# Patient Record
Sex: Male | Born: 1975 | Race: White | Hispanic: No | Marital: Married | Smoking: Former smoker
Health system: Southern US, Community
[De-identification: ages and names within clinical notes are randomized; demographics above are authoritative.]

## PROBLEM LIST (undated history)

## (undated) DIAGNOSIS — M1990 Unspecified osteoarthritis, unspecified site: Secondary | ICD-10-CM

## (undated) DIAGNOSIS — J45909 Unspecified asthma, uncomplicated: Secondary | ICD-10-CM

## (undated) DIAGNOSIS — N200 Calculus of kidney: Secondary | ICD-10-CM

## (undated) DIAGNOSIS — K219 Gastro-esophageal reflux disease without esophagitis: Secondary | ICD-10-CM

## (undated) DIAGNOSIS — M419 Scoliosis, unspecified: Secondary | ICD-10-CM

## (undated) DIAGNOSIS — Z87442 Personal history of urinary calculi: Secondary | ICD-10-CM

## (undated) HISTORY — PX: CARPAL TUNNEL RELEASE: SHX101

## (undated) HISTORY — DX: Scoliosis, unspecified: M41.9

## (undated) HISTORY — PX: KNEE ARTHROSCOPY: SUR90

## (undated) HISTORY — PX: FRACTURE SURGERY: SHX138

## (undated) HISTORY — DX: Calculus of kidney: N20.0

## (undated) HISTORY — PX: OTHER SURGICAL HISTORY: SHX169

## (undated) HISTORY — PX: APPENDECTOMY: SHX54

---

## 2014-12-24 ENCOUNTER — Other Ambulatory Visit (HOSPITAL_COMMUNITY): Payer: Self-pay | Admitting: "Endocrinology

## 2014-12-24 DIAGNOSIS — E298 Other testicular dysfunction: Secondary | ICD-10-CM

## 2015-01-02 ENCOUNTER — Other Ambulatory Visit (HOSPITAL_COMMUNITY): Payer: Self-pay

## 2015-01-03 ENCOUNTER — Ambulatory Visit (HOSPITAL_COMMUNITY)
Admission: RE | Admit: 2015-01-03 | Discharge: 2015-01-03 | Disposition: A | Discharge status: Home / Self Care | Payer: No Typology Code available for payment source | Source: Ambulatory Visit | Attending: "Endocrinology | Admitting: "Endocrinology

## 2015-01-03 ENCOUNTER — Other Ambulatory Visit (HOSPITAL_COMMUNITY): Payer: Self-pay | Admitting: "Endocrinology

## 2015-01-03 DIAGNOSIS — Z87828 Personal history of other (healed) physical injury and trauma: Secondary | ICD-10-CM | POA: Insufficient documentation

## 2015-01-03 DIAGNOSIS — E298 Other testicular dysfunction: Secondary | ICD-10-CM

## 2015-01-03 DIAGNOSIS — E348 Other specified endocrine disorders: Secondary | ICD-10-CM | POA: Diagnosis present

## 2015-01-03 MED ORDER — GADOBENATE DIMEGLUMINE 529 MG/ML IV SOLN
10.0000 mL | Freq: Once | INTRAVENOUS | Status: AC | PRN
  Administered 2015-01-03: 10 mL via INTRAVENOUS

## 2015-01-03 MED ORDER — SODIUM CHLORIDE 0.9 % IV SOLN
INTRAVENOUS | Status: AC
  Filled 2015-01-03: qty 150

## 2015-01-03 MED ORDER — SODIUM CHLORIDE 0.9 % IJ SOLN
INTRAMUSCULAR | Status: AC
  Filled 2015-01-03: qty 15

## 2015-04-04 ENCOUNTER — Telehealth: Payer: Self-pay

## 2015-04-04 ENCOUNTER — Other Ambulatory Visit: Payer: Self-pay | Admitting: "Endocrinology

## 2015-04-04 MED ORDER — TESTOSTERONE CYPIONATE 200 MG/ML IM SOLN: 100.0000 mg | INTRAMUSCULAR | Status: DC

## 2015-04-04 NOTE — Telephone Encounter (Signed)
Pt need refill on Testosterone. Printed and signed

## 2015-04-07 NOTE — Telephone Encounter (Signed)
Prescription faxed

## 2015-04-25 ENCOUNTER — Encounter: Payer: Self-pay | Admitting: "Endocrinology

## 2015-04-25 ENCOUNTER — Ambulatory Visit (INDEPENDENT_AMBULATORY_CARE_PROVIDER_SITE_OTHER): Payer: No Typology Code available for payment source | Admitting: "Endocrinology

## 2015-04-25 VITALS — BP 142/100 | HR 89 | Ht 70.0 in | Wt 239.4 lb

## 2015-04-25 DIAGNOSIS — R7303 Prediabetes: Secondary | ICD-10-CM

## 2015-04-25 DIAGNOSIS — E291 Testicular hypofunction: Secondary | ICD-10-CM | POA: Diagnosis not present

## 2015-04-25 DIAGNOSIS — E559 Vitamin D deficiency, unspecified: Secondary | ICD-10-CM | POA: Insufficient documentation

## 2015-04-25 MED ORDER — VITAMIN D (ERGOCALCIFEROL) 1.25 MG (50000 UNIT) PO CAPS: 50000.0000 [IU] | ORAL_CAPSULE | ORAL | Status: DC

## 2015-04-25 MED ORDER — TESTOSTERONE CYPIONATE 200 MG/ML IM SOLN: 100.0000 mg | INTRAMUSCULAR | Status: DC

## 2015-04-25 NOTE — Progress Notes (Signed)
Subjective:    Patient ID: Ian Robinson, male    DOB: 1975/06/13, PCP No PCP Per Patient   Past Medical History  Diagnosis Date  . Scoliosis   . Kidney stone    Past Surgical History  Procedure Laterality Date  . Appendectomy    . Arm fracture surgery     Social History   Social History  . Marital Status: Married    Spouse Name: N/A  . Number of Children: N/A  . Years of Education: N/A   Social History Main Topics  . Smoking status: Former Smoker    Quit date: 06/08/2007  . Smokeless tobacco: Never Used  . Alcohol Use: 3.6 oz/week    6 Cans of beer per week     Comment: occ 2 to 3 times a week  . Drug Use: No  . Sexual Activity: Not Asked   Other Topics Concern  . None   Social History Narrative  . None   Outpatient Encounter Prescriptions as of 04/25/2015  Medication Sig  . cetirizine (ZYRTEC) 10 MG tablet Take 10 mg by mouth daily.  . cyclobenzaprine (FLEXERIL) 10 MG tablet Take 10 mg by mouth as needed for muscle spasms.  . montelukast (SINGULAIR) 10 MG tablet Take 10 mg by mouth at bedtime.  Marland Kitchen testosterone cypionate (DEPOTESTOSTERONE CYPIONATE) 200 MG/ML injection Inject 0.5 mLs (100 mg total) into the muscle every 14 (fourteen) days.  . [DISCONTINUED] testosterone cypionate (DEPOTESTOSTERONE CYPIONATE) 200 MG/ML injection Inject 100 mg into the muscle every 14 (fourteen) days.  Marland Kitchen testosterone cypionate (DEPOTESTOSTERONE CYPIONATE) 200 MG/ML injection Inject 0.5 mLs (100 mg total) into the muscle every 14 (fourteen) days. (Patient not taking: Reported on 04/25/2015)  . Vitamin D, Ergocalciferol, (DRISDOL) 50000 UNITS CAPS capsule Take 1 capsule (50,000 Units total) by mouth every 7 (seven) days.   No facility-administered encounter medications on file as of 04/25/2015.   ALLERGIES: No Known Allergies VACCINATION STATUS:  There is no immunization history on file for this patient.  HPI  Ian Robinson is a 39 yr old male with medical hx as above. he is here  to f/u for hypogonadism with repeat labs. He was initiated on testosterone last visit. He used 200 mg every 2 weeks instead of 100 mg every 2 weeks. He started to feel better, currently having more energy than before.   On interview, he has no specific complaints. he has normal libido. he did not father any children, saying that his wife has fertility problems. he was subjected to sperm analysis in the past when he was found to have "low sperm count and motility". he denies testicular injury, chemotherapy, radiation, nor STDs. He reiterated that they are not looking for fertility. His MRI is negative for pituitary/sella mass lesion nor adenoma. he has remote ( at age 73) past face injury with no associated intracranial injury. His prolactin level was also normal, see below.  he has never taken androgens, steroids, nor opiates. he has new c/o frequent sinus infections.   Review of Systems  Constitutional: no weight gain/loss, no fatigue, no subjective hyperthermia/hypothermia Eyes: no blurry vision, no xerophthalmia ENT: no sore throat, no nodules palpated in throat, no dysphagia/odynophagia, no hoarseness Cardiovascular: no CP/SOB/palpitations/leg swelling Respiratory: no cough/SOB Gastrointestinal: no N/V/D/C Musculoskeletal: no muscle/joint aches Skin: no rashes Neurological: no tremors/numbness/tingling/dizziness Psychiatric: no depression/anxiety  Objective:    BP 142/100 mmHg  Pulse 89  Ht  (1.778 m)  Wt 239 lb 6.4 oz (108.591 kg)  BMI  34.35 kg/m2  SpO2 95%  Wt Readings from Last 3 Encounters:  04/25/15 239 lb 6.4 oz (108.591 kg)  01/03/15 235 lb (106.595 kg)    Physical Exam   Constitutional: overweight, in NAD Eyes: PERRLA, EOMI, no exophthalmos ENT: moist mucous membranes, no thyromegaly, no cervical lymphadenopathy Cardiovascular: RRR, No MRG Respiratory: CTA B Gastrointestinal: abdomen soft, NT, ND, BS+ Musculoskeletal: no deformities, strength intact in all  4 Skin: moist, warm, no rashes Neurological: no tremor with outstretched hands, DTR normal in all 4     Assessment & Plan:   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 His testosterone is now 536. He is not actively seeking fertility at this time.   I will continue Testosterone 100mg  IM every 2 weeks , SE discussed with him. He will RTN in 3 months with repeat labs. His MRI is negative for sella/pituitary mass lesions. His PRL is normal at 13.3.   I will obtain: - CBC with Differential/Platelet - Testosterone - AST - ALT  Before his next visit  2. Vitamin D deficiency -New diagnosis. I will initiate vitamin D 50,000 units weekly for the next 12 weeks.  3. Prediabetes -His fasting blood glucose was 132 suspicious for fasting glucose intolerance. I will obtain Hemoglobin A1c before his next visit.  - I advised patient to maintain close follow up with their PCP for primary care needs. Follow up plan: Return in about 3 months (around 07/26/2015) for testosterone deficiency, vitamin d deficiency, huperglycemia, follow up with pre-visit labs.  Ian LunchGebre Gergory Biello, MD Phone: [phone]  Fax: [phone]   04/25/2015, 9:35 AM

## 2015-07-09 ENCOUNTER — Telehealth: Payer: Self-pay | Admitting: "Endocrinology

## 2015-07-09 NOTE — Telephone Encounter (Signed)
Called asking about his appeal with the insurance - Selena Batten has sent it and we are waiting to hear back

## 2015-08-01 ENCOUNTER — Ambulatory Visit: Payer: No Typology Code available for payment source | Admitting: "Endocrinology

## 2015-10-04 ENCOUNTER — Other Ambulatory Visit: Payer: Self-pay | Admitting: "Endocrinology

## 2015-10-04 LAB — TESTOSTERONE: Testosterone: 188 ng/dL — ABNORMAL LOW (ref 250–827)

## 2015-10-04 LAB — CBC WITH DIFFERENTIAL/PLATELET
BASOS ABS: 65 {cells}/uL (ref 0–200)
BASOS PCT: 1 %
EOS ABS: 520 {cells}/uL — AB (ref 15–500)
Eosinophils Relative: 8 %
HEMATOCRIT: 42.7 % (ref 38.5–50.0)
Hemoglobin: 14.9 g/dL (ref 13.2–17.1)
LYMPHS PCT: 29 %
Lymphs Abs: 1885 cells/uL (ref 850–3900)
MCH: 29.4 pg (ref 27.0–33.0)
MCHC: 34.9 g/dL (ref 32.0–36.0)
MCV: 84.4 fL (ref 80.0–100.0)
MONO ABS: 455 {cells}/uL (ref 200–950)
MPV: 9.3 fL (ref 7.5–12.5)
Monocytes Relative: 7 %
NEUTROS PCT: 55 %
Neutro Abs: 3575 cells/uL (ref 1500–7800)
PLATELETS: 217 10*3/uL (ref 140–400)
RBC: 5.06 MIL/uL (ref 4.20–5.80)
RDW: 14.4 % (ref 11.0–15.0)
WBC: 6.5 10*3/uL (ref 3.8–10.8)

## 2015-10-04 LAB — HEMOGLOBIN A1C
HEMOGLOBIN A1C: 6.1 % — AB (ref <5.7)
Mean Plasma Glucose: 128 mg/dL

## 2015-10-04 LAB — ALT: ALT: 44 U/L (ref 9–46)

## 2015-10-04 LAB — AST: AST: 30 U/L (ref 10–40)

## 2015-10-06 LAB — VITAMIN D 25 HYDROXY (VIT D DEFICIENCY, FRACTURES): Vit D, 25-Hydroxy: 19 ng/mL — ABNORMAL LOW (ref 30–100)

## 2015-10-23 ENCOUNTER — Telehealth: Payer: Self-pay

## 2015-10-23 NOTE — Telephone Encounter (Signed)
Letter of appeal sent to [employer] for PA on Testosterone

## 2015-11-11 ENCOUNTER — Telehealth: Payer: Self-pay

## 2015-11-11 NOTE — Telephone Encounter (Signed)
Contacted pts insurance co regarding PA Appeal for Testosterone Inj. I sent this in on 10-11-15. They state that they have the appeal letter sent in by Dr Fransico HimNida however could not give me an answer. They state this is still pending.

## 2015-11-17 ENCOUNTER — Telehealth: Payer: Self-pay | Admitting: "Endocrinology

## 2015-11-17 NOTE — Telephone Encounter (Signed)
We have not seen [name]  in office since November. He will need an office visit before we prescribe.  His appeal may still  be in process.

## 2015-11-17 NOTE — Telephone Encounter (Signed)
Pt is requesting that we send a prescription for Testosterone to pharmacy. He will need a printed signed copy. He states he will pay out of pocket since his insurance is not responding to the appeal or he has questioned if he can take a different medication?

## 2015-11-17 NOTE — Telephone Encounter (Signed)
Pt notified and he made appt.

## 2015-11-17 NOTE — Telephone Encounter (Signed)
pt asked to just send in prescription and he will just have to pay out of pocket since insurance is taking so long

## 2015-11-18 ENCOUNTER — Ambulatory Visit (INDEPENDENT_AMBULATORY_CARE_PROVIDER_SITE_OTHER): Payer: BLUE CROSS/BLUE SHIELD | Admitting: "Endocrinology

## 2015-11-18 ENCOUNTER — Encounter: Payer: Self-pay | Admitting: "Endocrinology

## 2015-11-18 VITALS — BP 141/84 | HR 94 | Ht 70.0 in | Wt 238.0 lb

## 2015-11-18 DIAGNOSIS — E559 Vitamin D deficiency, unspecified: Secondary | ICD-10-CM

## 2015-11-18 DIAGNOSIS — E291 Testicular hypofunction: Secondary | ICD-10-CM | POA: Diagnosis not present

## 2015-11-18 DIAGNOSIS — I10 Essential (primary) hypertension: Secondary | ICD-10-CM

## 2015-11-18 DIAGNOSIS — R7303 Prediabetes: Secondary | ICD-10-CM

## 2015-11-18 MED ORDER — VITAMIN D (ERGOCALCIFEROL) 1.25 MG (50000 UNIT) PO CAPS: 50000.0000 [IU] | ORAL_CAPSULE | ORAL | Status: DC

## 2015-11-18 MED ORDER — TESTOSTERONE CYPIONATE 200 MG/ML IM SOLN: 100.0000 mg | INTRAMUSCULAR | Status: DC

## 2015-11-18 NOTE — Progress Notes (Signed)
Subjective:    Patient ID: Ian Robinson, male    DOB: 1975/06/20, PCP No PCP Per Patient   Past Medical History  Diagnosis Date  . Scoliosis   . Kidney stone    Past Surgical History  Procedure Laterality Date  . Appendectomy    . Arm fracture surgery     Social History   Social History  . Marital Status: Married    Spouse Name: N/A  . Number of Children: N/A  . Years of Education: N/A   Social History Main Topics  . Smoking status: Former Smoker    Quit date: 06/08/2007  . Smokeless tobacco: Never Used  . Alcohol Use: 3.6 oz/week    6 Cans of beer per week     Comment: occ 2 to 3 times a week  . Drug Use: No  . Sexual Activity: Not Asked   Other Topics Concern  . None   Social History Narrative   Outpatient Encounter Prescriptions as of 11/18/2015  Medication Sig  . cetirizine (ZYRTEC) 10 MG tablet Take 10 mg by mouth daily.  Marland Kitchen testosterone cypionate (DEPOTESTOSTERONE CYPIONATE) 200 MG/ML injection Inject 0.5 mLs (100 mg total) into the muscle every 14 (fourteen) days.  . Vitamin D, Ergocalciferol, (DRISDOL) 50000 units CAPS capsule Take 1 capsule (50,000 Units total) by mouth every 7 (seven) days.  . [DISCONTINUED] cyclobenzaprine (FLEXERIL) 10 MG tablet Take 10 mg by mouth as needed for muscle spasms.  . [DISCONTINUED] montelukast (SINGULAIR) 10 MG tablet Take 10 mg by mouth at bedtime.  . [DISCONTINUED] testosterone cypionate (DEPOTESTOSTERONE CYPIONATE) 200 MG/ML injection Inject 0.5 mLs (100 mg total) into the muscle every 14 (fourteen) days. (Patient not taking: Reported on 04/25/2015)  . [DISCONTINUED] testosterone cypionate (DEPOTESTOSTERONE CYPIONATE) 200 MG/ML injection Inject 0.5 mLs (100 mg total) into the muscle every 14 (fourteen) days.  . [DISCONTINUED] Vitamin D, Ergocalciferol, (DRISDOL) 50000 UNITS CAPS capsule Take 1 capsule (50,000 Units total) by mouth every 7 (seven) days.   No facility-administered encounter medications on file as of  11/18/2015.   ALLERGIES: No Known Allergies VACCINATION STATUS:  There is no immunization history on file for this patient.  HPI  Ian Robinson is a 40 yr old male with medical hx as above.  he is here to f/u for hypogonadism with repeat labs. He was initiated on testosterone last year .  He used 100 mg every 2 weeks and improved his testosterone to 536, but then his insurance stopped paying for it, despite several attempts with appeal to get him help. - After withdrawn, his testosterone drop to 188. -He started to feel symptoms of hypogonadism, including low energy, and low libido. -He wants to have prescription again even if he has to pay out of pocket for it. -he did not father any children, saying that his wife has fertility problems. he was subjected to sperm analysis in the past when he was found to have "low sperm count and motility". he denies testicular injury, chemotherapy, radiation, nor STDs. He reiterated that they are not looking for fertility. His MRI is negative for pituitary/sella mass lesion nor adenoma. he has remote ( at age 41) past face injury with no associated intracranial injury. His prolactin level was also normal, see below.  he has never taken androgens, steroids, nor opiates. he has new c/o frequent sinus infections.   Review of Systems  Constitutional: no weight gain/loss, no fatigue, no subjective hyperthermia/hypothermia Eyes: no blurry vision, no xerophthalmia ENT: no sore throat,  no nodules palpated in throat, no dysphagia/odynophagia, no hoarseness Cardiovascular: no CP/SOB/palpitations/leg swelling Respiratory: no cough/SOB Gastrointestinal: no N/V/D/C Musculoskeletal: no muscle/joint aches Skin: no rashes Neurological: no tremors/numbness/tingling/dizziness Psychiatric: no depression/anxiety  Objective:    BP 141/84 mmHg  Pulse 94  Ht 5\' 10"  (1.778 m)  Wt 238 lb (107.956 kg)  BMI 34.15 kg/m2  Wt Readings from Last 3 Encounters:  11/18/15  238 lb (107.956 kg)  04/25/15 239 lb 6.4 oz (108.591 kg)  01/03/15 235 lb (106.595 kg)    Physical Exam   Constitutional: overweight, in NAD Eyes: PERRLA, EOMI, no exophthalmos ENT: moist mucous membranes, no thyromegaly, no cervical lymphadenopathy Cardiovascular: RRR, No MRG Respiratory: CTA B Gastrointestinal: abdomen soft, NT, ND, BS+ Musculoskeletal: no deformities, strength intact in all 4 Skin: moist, warm, no rashes Neurological: no tremor with outstretched hands, DTR normal in all 4     Assessment & Plan:   1. 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 His testosterone is now 188 dropping from  536. This is due to withdrawal of his testosterone because of lack of coverage by his insurance.  - While waiting for the decision by his insurance company, he wants to have a prescription again even if he has to pay out of pocket.    I will resume Testosterone 100mg  IM every 2 weeks , SE discussed with him. He will RTN in 3 months with repeat labs. His MRI is negative for sella/pituitary mass lesions. His PRL is normal at 13.3.   I will obtain: - CBC with Differential/Platelet - Testosterone - AST - ALT  Before his next visit  2. Vitamin D deficiency -I will re-initiate vitamin D 50,000 units weekly for the next 12 weeks. He can continue vitamin D3 5000 units daily afterwards.   3. Prediabetes: Last year his A1c was 6.1% consistent with prediabetes. He is not on any medications. I have given him exercise in carbohydrates management plan. ll obtain Hemoglobin A1c before his next visit.  4. Hypertension:  - this is a new diagnosis. He mentions excessive salt intake this morning. I have advised him to avoid salt and increase exercise. If blood pressure continues to be above target he would be considered for therapy next visit. - I advised patient to maintain close follow up with their PCP for primary care  needs. Follow up plan: Return in about 3 months (around 02/18/2016) for follow up with pre-visit labs.  Ian LunchGebre Nida, MD Phone: [phone]  Fax: [phone]   11/18/2015, 10:56 AM

## 2015-12-01 ENCOUNTER — Telehealth: Payer: Self-pay

## 2015-12-01 NOTE — Telephone Encounter (Signed)
Mr Ian Robinson called today to get the status on his appeal for Testosterone. After several attempts made since 2/17. I still do not have an answer from the appeal sent on 10-11-15. I confirmed that the insurance company does have the appeal but have yet to give a yes or no to the appeal. The pt is now paying out of pocket for the Testosterone. Is there anything else he can try before i call the pt back?

## 2015-12-01 NOTE — Telephone Encounter (Signed)
There is no other option for him, unless he wants to stop testosterone,  which he is clearly benefiting from. During his last visit, he agreed to pay out of pocket while his appeal processes ongoing.

## 2015-12-02 NOTE — Telephone Encounter (Signed)
Pt.notified

## 2016-01-27 ENCOUNTER — Other Ambulatory Visit: Payer: Self-pay | Admitting: "Endocrinology

## 2016-01-27 LAB — HEPATIC FUNCTION PANEL
ALBUMIN: 4.4 g/dL (ref 3.6–5.1)
ALK PHOS: 40 U/L (ref 40–115)
ALT: 16 U/L (ref 9–46)
AST: 16 U/L (ref 10–40)
BILIRUBIN DIRECT: 0.1 mg/dL (ref <=0.2)
BILIRUBIN TOTAL: 0.5 mg/dL (ref 0.2–1.2)
Indirect Bilirubin: 0.4 mg/dL (ref 0.2–1.2)
Total Protein: 6.8 g/dL (ref 6.1–8.1)

## 2016-01-27 LAB — CBC WITH DIFFERENTIAL/PLATELET
BASOS ABS: 52 {cells}/uL (ref 0–200)
Basophils Relative: 1 %
EOS ABS: 676 {cells}/uL — AB (ref 15–500)
EOS PCT: 13 %
HEMATOCRIT: 43.7 % (ref 38.5–50.0)
HEMOGLOBIN: 14.4 g/dL (ref 13.2–17.1)
LYMPHS ABS: 1196 {cells}/uL (ref 850–3900)
Lymphocytes Relative: 23 %
MCH: 28.5 pg (ref 27.0–33.0)
MCHC: 33 g/dL (ref 32.0–36.0)
MCV: 86.4 fL (ref 80.0–100.0)
MONO ABS: 364 {cells}/uL (ref 200–950)
MPV: 9.5 fL (ref 7.5–12.5)
Monocytes Relative: 7 %
NEUTROS ABS: 2912 {cells}/uL (ref 1500–7800)
Neutrophils Relative %: 56 %
Platelets: 206 10*3/uL (ref 140–400)
RBC: 5.06 MIL/uL (ref 4.20–5.80)
RDW: 14.6 % (ref 11.0–15.0)
WBC: 5.2 10*3/uL (ref 3.8–10.8)

## 2016-01-28 LAB — TESTOSTERONE: Testosterone: 163 ng/dL — ABNORMAL LOW (ref 250–827)

## 2016-01-28 LAB — HEMOGLOBIN A1C
Hgb A1c MFr Bld: 5.3 % (ref <5.7)
Mean Plasma Glucose: 105 mg/dL

## 2016-02-16 ENCOUNTER — Ambulatory Visit: Payer: BLUE CROSS/BLUE SHIELD | Admitting: "Endocrinology

## 2016-03-03 ENCOUNTER — Encounter: Payer: Self-pay | Admitting: "Endocrinology

## 2016-03-03 ENCOUNTER — Ambulatory Visit (INDEPENDENT_AMBULATORY_CARE_PROVIDER_SITE_OTHER): Payer: BLUE CROSS/BLUE SHIELD | Admitting: "Endocrinology

## 2016-03-03 VITALS — BP 111/80 | HR 75 | Ht 70.0 in | Wt 215.0 lb

## 2016-03-03 DIAGNOSIS — E291 Testicular hypofunction: Secondary | ICD-10-CM

## 2016-03-03 DIAGNOSIS — R7303 Prediabetes: Secondary | ICD-10-CM

## 2016-03-03 DIAGNOSIS — E559 Vitamin D deficiency, unspecified: Secondary | ICD-10-CM

## 2016-03-03 MED ORDER — "SYRINGE 20G X 1"" 3 ML MISC": 0 refills | Status: DC

## 2016-03-03 MED ORDER — VITAMIN D (ERGOCALCIFEROL) 1.25 MG (50000 UNIT) PO CAPS: 50000.0000 [IU] | ORAL_CAPSULE | ORAL | 0 refills | Status: DC

## 2016-03-03 MED ORDER — TESTOSTERONE CYPIONATE 200 MG/ML IM SOLN: 200.0000 mg | INTRAMUSCULAR | 0 refills | Status: DC

## 2016-03-03 NOTE — Progress Notes (Signed)
Subjective:    Patient ID: Ian Robinson, male    DOB: [birth year], PCP No PCP Per Patient   Past Medical History:  Diagnosis Date  . Kidney stone   . Scoliosis    Past Surgical History:  Procedure Laterality Date  . APPENDECTOMY    . arm fracture surgery     Social History   Social History  . Marital status: Married    Spouse name: N/A  . Number of children: N/A  . Years of education: N/A   Social History Main Topics  . Smoking status: Former Smoker    Quit date: 06/08/2007  . Smokeless tobacco: Never Used  . Alcohol use 3.6 oz/week    6 Cans of beer per week     Comment: occ 2 to 3 times a week  . Drug use: No  . Sexual activity: Not Asked   Other Topics Concern  . None   Social History Narrative  . None   Outpatient Encounter Prescriptions as of 03/03/2016  Medication Sig  . cetirizine (ZYRTEC) 10 MG tablet Take 10 mg by mouth daily.  . Syringe/Needle, Disp, (SYRINGE 3CC/20GX1") 20G X 1" 3 ML MISC Use to inject Testosterone 2 times a month  . testosterone cypionate (DEPOTESTOSTERONE CYPIONATE) 200 MG/ML injection Inject 1 mL (200 mg total) into the muscle every 14 (fourteen) days.  . Vitamin D, Ergocalciferol, (DRISDOL) 50000 units CAPS capsule Take 1 capsule (50,000 Units total) by mouth every 7 (seven) days.  . [DISCONTINUED] testosterone cypionate (DEPOTESTOSTERONE CYPIONATE) 200 MG/ML injection Inject 0.5 mLs (100 mg total) into the muscle every 14 (fourteen) days.  . [DISCONTINUED] Vitamin D, Ergocalciferol, (DRISDOL) 50000 units CAPS capsule Take 1 capsule (50,000 Units total) by mouth every 7 (seven) days.   No facility-administered encounter medications on file as of 03/03/2016.    ALLERGIES: No Known Allergies VACCINATION STATUS:  There is no immunization history on file for this patient.  HPI  [name] is a 40 yr old male with medical hx as above.  he is here to f/u for hypogonadism with repeat labs. He was initiated on testosterone last year  . - He was reinitiated on testosterone 100 mg IM every other week last visit. He is compliant. -He started to feel symptoms of hypogonadism, including low energy, and low libido.  -he did not father any children, saying that his wife has fertility problems. he was subjected to sperm analysis in the past when he was found to have "low sperm count and motility". he denies testicular injury, chemotherapy, radiation, nor STDs. He reiterated that they are not looking for fertility. His MRI is negative for pituitary/sella mass lesion nor adenoma. he has remote ( at age 40) past face injury with no associated intracranial injury. His prolactin level was also normal, see below.  he has never taken androgens, steroids, nor opiates. he has new c/o frequent sinus infections.   Review of Systems  Constitutional:  +weight loss, no fatigue, no subjective hyperthermia/hypothermia Eyes: no blurry vision, no xerophthalmia ENT: no sore throat, no nodules palpated in throat, no dysphagia/odynophagia, no hoarseness Cardiovascular: no CP/SOB/palpitations/leg swelling Respiratory: no cough/SOB Gastrointestinal: no N/V/D/C Musculoskeletal: no muscle/joint aches Skin: no rashes Neurological: no tremors/numbness/tingling/dizziness Psychiatric: no depression/anxiety  Objective:    BP 111/80   Pulse 75   Ht 5\' 10"  (1.778 m)   Wt 215 lb (97.5 kg)   BMI 30.85 kg/m   Wt Readings from Last 3 Encounters:  03/03/16 215 lb (97.5 kg)  11/18/15 238 lb (108 kg)  04/25/15 239 lb 6.4 oz (108.6 kg)    Physical Exam   Constitutional: overweight, in NAD Eyes: PERRLA, EOMI, no exophthalmos ENT: moist mucous membranes, no thyromegaly, no cervical lymphadenopathy Cardiovascular: RRR, No MRG Respiratory: CTA B Gastrointestinal: abdomen soft, NT, ND, BS+ Musculoskeletal: no deformities, strength intact in all 4 Skin: moist, warm, no rashes Neurological: no tremor with outstretched hands, DTR normal in all  4   Assessment & Plan:   1. 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 His testosterone is now 163  Prior to his injection on 01/23/2016. .   - He would benefit from increased dose of testosterone. I will increase Testosterone to 200mg  IM every 2 weeks , SE discussed with him. He will RTN in 3 months with repeat labs. His MRI is negative for sella/pituitary mass lesions. His PRL is normal at 13.3.   I will obtain: - CBC with Differential/Platelet - Testosterone - AST - ALT  Before his next visit  2. Vitamin D deficiency -I will continue vitamin D 50,000 units weekly . He can continue vitamin D3 5000 units daily afterwards.  He would have vitamin D level checked during his next visit.  3. Prediabetes: - Due to his significant weight loss since last visit about 24 pounds, his A1c has improved to 5.3% from 6.1% .  He is not on any medications. I advised him to until you to engage in exercise and carbohydrates management plan.  4. Hypertension:  - Controlled With no medications , mainly due to recent significant weight loss. See above  - I advised patient to maintain close follow up with their PCP for primary care needs. Follow up plan: Return in about 3 months (around 06/02/2016) for follow up with pre-visit labs.  Marquis Lunch, MD Phone: [phone]  Fax: [phone]   03/03/2016, 10:59 AM

## 2016-05-24 ENCOUNTER — Other Ambulatory Visit: Payer: Self-pay | Admitting: "Endocrinology

## 2016-05-24 LAB — CBC WITH DIFFERENTIAL/PLATELET
BASOS ABS: 53 {cells}/uL (ref 0–200)
Basophils Relative: 1 %
EOS ABS: 477 {cells}/uL (ref 15–500)
Eosinophils Relative: 9 %
HEMATOCRIT: 45.9 % (ref 38.5–50.0)
HEMOGLOBIN: 15.5 g/dL (ref 13.2–17.1)
LYMPHS ABS: 1431 {cells}/uL (ref 850–3900)
LYMPHS PCT: 27 %
MCH: 29.7 pg (ref 27.0–33.0)
MCHC: 33.8 g/dL (ref 32.0–36.0)
MCV: 87.9 fL (ref 80.0–100.0)
MONO ABS: 318 {cells}/uL (ref 200–950)
MPV: 8.9 fL (ref 7.5–12.5)
Monocytes Relative: 6 %
NEUTROS PCT: 57 %
Neutro Abs: 3021 cells/uL (ref 1500–7800)
Platelets: 188 10*3/uL (ref 140–400)
RBC: 5.22 MIL/uL (ref 4.20–5.80)
RDW: 14.4 % (ref 11.0–15.0)
WBC: 5.3 10*3/uL (ref 3.8–10.8)

## 2016-05-25 LAB — TESTOSTERONE, FREE AND TOTAL (INCLUDES SHBG)-(MALES)
Sex Hormone Binding: 13 nmol/L (ref 10–50)
TESTOSTERONE: 772 ng/dL (ref 250–827)
Testosterone, Free: 254.2 pg/mL — ABNORMAL HIGH (ref 47.0–244.0)
Testosterone-% Free: 3.3 % — ABNORMAL HIGH (ref 1.6–2.9)

## 2016-05-25 LAB — VITAMIN D 25 HYDROXY (VIT D DEFICIENCY, FRACTURES): Vit D, 25-Hydroxy: 33 ng/mL (ref 30–100)

## 2016-06-02 ENCOUNTER — Ambulatory Visit (INDEPENDENT_AMBULATORY_CARE_PROVIDER_SITE_OTHER): Payer: BLUE CROSS/BLUE SHIELD | Admitting: "Endocrinology

## 2016-06-02 ENCOUNTER — Encounter: Payer: Self-pay | Admitting: "Endocrinology

## 2016-06-02 VITALS — BP 106/78 | HR 110 | Ht 70.0 in | Wt 219.0 lb

## 2016-06-02 DIAGNOSIS — E291 Testicular hypofunction: Secondary | ICD-10-CM

## 2016-06-02 DIAGNOSIS — I10 Essential (primary) hypertension: Secondary | ICD-10-CM | POA: Diagnosis not present

## 2016-06-02 DIAGNOSIS — E559 Vitamin D deficiency, unspecified: Secondary | ICD-10-CM | POA: Diagnosis not present

## 2016-06-02 DIAGNOSIS — R7303 Prediabetes: Secondary | ICD-10-CM

## 2016-06-02 MED ORDER — TESTOSTERONE CYPIONATE 200 MG/ML IM SOLN: 200.0000 mg | INTRAMUSCULAR | 0 refills | Status: DC

## 2016-06-02 MED ORDER — VITAMIN D (ERGOCALCIFEROL) 1.25 MG (50000 UNIT) PO CAPS: 50000.0000 [IU] | ORAL_CAPSULE | ORAL | 0 refills | Status: DC

## 2016-06-02 NOTE — Progress Notes (Signed)
Subjective:    Patient ID: Ian Robinson, male    DOB: 06-07-1976, PCP No PCP Per Patient   Past Medical History:  Diagnosis Date  . Kidney stone   . Scoliosis    Past Surgical History:  Procedure Laterality Date  . APPENDECTOMY    . arm fracture surgery     Social History   Social History  . Marital status: Married    Spouse name: N/A  . Number of children: N/A  . Years of education: N/A   Social History Main Topics  . Smoking status: Former Smoker    Quit date: 06/08/2007  . Smokeless tobacco: Never Used  . Alcohol use 3.6 oz/week    6 Cans of beer per week     Comment: occ 2 to 3 times a week  . Drug use: No  . Sexual activity: Not Asked   Other Topics Concern  . None   Social History Narrative  . None   Outpatient Encounter Prescriptions as of 06/02/2016  Medication Sig  . cetirizine (ZYRTEC) 10 MG tablet Take 10 mg by mouth daily.  . Syringe/Needle, Disp, (SYRINGE 3CC/20GX1") 20G X 1" 3 ML MISC Use to inject Testosterone 2 times a month  . testosterone cypionate (DEPOTESTOSTERONE CYPIONATE) 200 MG/ML injection Inject 1 mL (200 mg total) into the muscle every 14 (fourteen) days.  . Vitamin D, Ergocalciferol, (DRISDOL) 50000 units CAPS capsule Take 1 capsule (50,000 Units total) by mouth every 7 (seven) days.  . [DISCONTINUED] testosterone cypionate (DEPOTESTOSTERONE CYPIONATE) 200 MG/ML injection Inject 1 mL (200 mg total) into the muscle every 14 (fourteen) days.  . [DISCONTINUED] Vitamin D, Ergocalciferol, (DRISDOL) 50000 units CAPS capsule Take 1 capsule (50,000 Units total) by mouth every 7 (seven) days.   No facility-administered encounter medications on file as of 06/02/2016.    ALLERGIES: No Known Allergies VACCINATION STATUS:  There is no immunization history on file for this patient.  HPI  Ian Robinson is a 40 yr old male with medical hx as above.  he is here to f/u for hypogonadism with repeat labs. He was initiated on testosterone last year . -  He was reinitiated on testosterone 100 mg IM every other week last visit. He is compliant. -He started to feel symptoms of hypogonadism, including low energy, and low libido.  -he did not father any children, saying that his wife has fertility problems. he was subjected to sperm analysis in the past when he was found to have "low sperm count and motility". he denies testicular injury, chemotherapy, radiation, nor STDs. He reiterated that they are not looking for fertility. His MRI is negative for pituitary/sella mass lesion nor adenoma. he has remote ( at age 40) past face injury with no associated intracranial injury. His prolactin level was also normal, see below.  he has never taken androgens, steroids, nor opiates. he has new c/o frequent sinus infections.   Review of Systems  Constitutional:  +weight loss, no fatigue, no subjective hyperthermia/hypothermia Eyes: no blurry vision, no xerophthalmia ENT: no sore throat, no nodules palpated in throat, no dysphagia/odynophagia, no hoarseness Cardiovascular: no CP/SOB/palpitations/leg swelling Respiratory: no cough/SOB Gastrointestinal: no N/V/D/C Musculoskeletal: no muscle/joint aches Skin: no rashes Neurological: no tremors/numbness/tingling/dizziness Psychiatric: no depression/anxiety  Objective:    BP 106/78   Pulse (!) 110   Ht 5\' 10"  (1.778 m)   Wt 219 lb (99.3 kg)   BMI 31.42 kg/m   Wt Readings from Last 3 Encounters:  06/02/16 219 lb (99.3  kg)  03/03/16 215 lb (97.5 kg)  11/18/15 238 lb (108 kg)    Physical Exam   Constitutional: overweight, in NAD Eyes: PERRLA, EOMI, no exophthalmos ENT: moist mucous membranes, no thyromegaly, no cervical lymphadenopathy Cardiovascular: RRR, No MRG Respiratory: CTA B Gastrointestinal: abdomen soft, NT, ND, BS+ Musculoskeletal: no deformities, strength intact in all 4 Skin: moist, warm, no rashes Neurological: no tremor with outstretched hands, DTR normal in all 4   Assessment  & Plan:   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 His testosterone is now  772   10 days after his 200 mg of testosterone injection on 05/24/2016. His CBC and hepatic panel are so far favorable for continued testosterone replacement.  I will  continue Testosterone 200mg  IM every 2 weeks , SE discussed with him. He will RTN in 3 months with repeat labs. His MRI is negative for sella/pituitary mass lesions. His PRL is normal at 13.3.   I will obtain: - CBC with Differential/Platelet - Testosterone - AST - ALT  Before his next visit  2. Vitamin D deficiency -I will continue vitamin D 50,000 units weekly . He can continue vitamin D3 5000 units daily afterwards.  He would have vitamin D level checked during his next visit.  3. Prediabetes: - Due to his significant weight loss since last visit about 24 pounds, his A1c has improved to 5.3% from 6.1% .  He is not on any medications. I advised him to until you to engage in exercise and carbohydrates management plan.  4. Hypertension:  - Controlled With no medications , mainly due to recent significant weight loss. See above  - I advised patient to maintain close follow up with their PCP for primary care needs.  Follow up plan: Return in about 3 months (around 08/31/2016) for follow up with pre-visit labs.  Marquis LunchGebre Camar Guyton, MD Phone: [phone]  Fax: [phone]   06/02/2016, 10:05 AM

## 2016-08-23 ENCOUNTER — Other Ambulatory Visit: Payer: Self-pay | Admitting: "Endocrinology

## 2016-08-23 LAB — COMPREHENSIVE METABOLIC PANEL
ALK PHOS: 37 U/L — AB (ref 40–115)
ALT: 13 U/L (ref 9–46)
AST: 15 U/L (ref 10–40)
Albumin: 4.5 g/dL (ref 3.6–5.1)
BILIRUBIN TOTAL: 0.9 mg/dL (ref 0.2–1.2)
BUN: 19 mg/dL (ref 7–25)
CALCIUM: 9.7 mg/dL (ref 8.6–10.3)
CO2: 29 mmol/L (ref 20–31)
Chloride: 104 mmol/L (ref 98–110)
Creat: 1.04 mg/dL (ref 0.60–1.35)
GLUCOSE: 92 mg/dL (ref 65–99)
POTASSIUM: 4.1 mmol/L (ref 3.5–5.3)
Sodium: 140 mmol/L (ref 135–146)
TOTAL PROTEIN: 7.2 g/dL (ref 6.1–8.1)

## 2016-08-23 LAB — CBC WITH DIFFERENTIAL/PLATELET
BASOS ABS: 56 {cells}/uL (ref 0–200)
Basophils Relative: 1 %
EOS ABS: 560 {cells}/uL — AB (ref 15–500)
Eosinophils Relative: 10 %
HCT: 47.3 % (ref 38.5–50.0)
Hemoglobin: 16.2 g/dL (ref 13.2–17.1)
Lymphocytes Relative: 22 %
Lymphs Abs: 1232 cells/uL (ref 850–3900)
MCH: 29.6 pg (ref 27.0–33.0)
MCHC: 34.2 g/dL (ref 32.0–36.0)
MCV: 86.5 fL (ref 80.0–100.0)
MONOS PCT: 7 %
MPV: 8.8 fL (ref 7.5–12.5)
Monocytes Absolute: 392 cells/uL (ref 200–950)
NEUTROS ABS: 3360 {cells}/uL (ref 1500–7800)
Neutrophils Relative %: 60 %
PLATELETS: 179 10*3/uL (ref 140–400)
RBC: 5.47 MIL/uL (ref 4.20–5.80)
RDW: 14.7 % (ref 11.0–15.0)
WBC: 5.6 10*3/uL (ref 3.8–10.8)

## 2016-08-23 LAB — PSA: PSA: 0.6 ng/mL (ref <=4.0)

## 2016-08-24 LAB — TESTOSTERONE TOTAL,FREE,BIO, MALES
Albumin: 4.5 g/dL (ref 3.6–5.1)
SEX HORMONE BINDING: 13 nmol/L (ref 10–50)
TESTOSTERONE FREE: 140 pg/mL (ref 46.0–224.0)
TESTOSTERONE: 533 ng/dL (ref 250–827)
Testosterone, Bioavailable: 287.9 ng/dL (ref 110.0–575.0)

## 2016-08-24 LAB — HEMOGLOBIN A1C
HEMOGLOBIN A1C: 5.1 % (ref <5.7)
Mean Plasma Glucose: 100 mg/dL

## 2016-09-01 ENCOUNTER — Encounter: Payer: Self-pay | Admitting: "Endocrinology

## 2016-09-01 ENCOUNTER — Ambulatory Visit (INDEPENDENT_AMBULATORY_CARE_PROVIDER_SITE_OTHER): Payer: BLUE CROSS/BLUE SHIELD | Admitting: "Endocrinology

## 2016-09-01 VITALS — BP 119/80 | HR 83 | Ht 70.0 in | Wt 225.0 lb

## 2016-09-01 DIAGNOSIS — E559 Vitamin D deficiency, unspecified: Secondary | ICD-10-CM | POA: Diagnosis not present

## 2016-09-01 DIAGNOSIS — E291 Testicular hypofunction: Secondary | ICD-10-CM | POA: Diagnosis not present

## 2016-09-01 DIAGNOSIS — I10 Essential (primary) hypertension: Secondary | ICD-10-CM

## 2016-09-01 DIAGNOSIS — R7303 Prediabetes: Secondary | ICD-10-CM | POA: Diagnosis not present

## 2016-09-01 MED ORDER — TESTOSTERONE CYPIONATE 200 MG/ML IM SOLN: 200.0000 mg | INTRAMUSCULAR | 0 refills | Status: DC

## 2016-09-01 NOTE — Progress Notes (Signed)
Subjective:    Patient ID: Ian Robinson, male    DOB: [birth year], PCP No PCP Per Patient   Past Medical History:  Diagnosis Date  . Kidney stone   . Scoliosis    Past Surgical History:  Procedure Laterality Date  . APPENDECTOMY    . arm fracture surgery     Social History   Social History  . Marital status: Married    Spouse name: N/A  . Number of children: N/A  . Years of education: N/A   Social History Main Topics  . Smoking status: Former Smoker    Quit date: 06/08/2007  . Smokeless tobacco: Never Used  . Alcohol use 3.6 oz/week    6 Cans of beer per week     Comment: occ 2 to 3 times a week  . Drug use: No  . Sexual activity: Not Asked   Other Topics Concern  . None   Social History Narrative  . None   Outpatient Encounter Prescriptions as of 09/01/2016  Medication Sig  . cetirizine (ZYRTEC) 10 MG tablet Take 10 mg by mouth daily.  . Syringe/Needle, Disp, (SYRINGE 3CC/20GX1") 20G X 1" 3 ML MISC Use to inject Testosterone 2 times a month  . testosterone cypionate (DEPOTESTOSTERONE CYPIONATE) 200 MG/ML injection Inject 1 mL (200 mg total) into the muscle every 14 (fourteen) days.  . [DISCONTINUED] testosterone cypionate (DEPOTESTOSTERONE CYPIONATE) 200 MG/ML injection Inject 1 mL (200 mg total) into the muscle every 14 (fourteen) days.  . [DISCONTINUED] Vitamin D, Ergocalciferol, (DRISDOL) 50000 units CAPS capsule Take 1 capsule (50,000 Units total) by mouth every 7 (seven) days.   No facility-administered encounter medications on file as of 09/01/2016.    ALLERGIES: No Known Allergies VACCINATION STATUS:  There is no immunization history on file for this patient.  HPI  [name] is a 41 yr old male with medical hx as above.  he is here to f/u for hypogonadism with repeat labs. He was initiated on testosterone in 2016. - He was reinitiated on testosterone 200 mg IM every other week last visit. He is compliant. -He started to feel symptoms of hypogonadism,  including low energy, and low libido.  -he did not father any children, saying that his wife has fertility problems. he was subjected to sperm analysis in the past when he was found to have "low sperm count and motility". he denies testicular injury, chemotherapy, radiation, nor STDs. He reiterated that they are not looking for fertility. His MRI is negative for pituitary/sella mass lesion nor adenoma. he has remote ( at age 41) past face injury with no associated intracranial injury. His prolactin level was also normal, see below.  he has never taken androgens, steroids, nor opiates. he has new c/o frequent sinus infections.   Review of Systems  Constitutional:  +weight loss, no fatigue, no subjective hyperthermia/hypothermia Eyes: no blurry vision, no xerophthalmia ENT: no sore throat, no nodules palpated in throat, no dysphagia/odynophagia, no hoarseness Cardiovascular: no CP/SOB/palpitations/leg swelling Respiratory: no cough/SOB Gastrointestinal: no N/V/D/C Musculoskeletal: no muscle/joint aches Skin: no rashes Neurological: no tremors/numbness/tingling/dizziness Psychiatric: no depression/anxiety  Objective:    BP 119/80   Pulse 83   Ht 5\' 10"  (1.778 m)   Wt 225 lb (102.1 kg)   BMI 32.28 kg/m   Wt Readings from Last 3 Encounters:  09/01/16 225 lb (102.1 kg)  06/02/16 219 lb (99.3 kg)  03/03/16 215 lb (97.5 kg)    Physical Exam   Constitutional: overweight, in NAD  Eyes: PERRLA, EOMI, no exophthalmos ENT: moist mucous membranes, no thyromegaly, no cervical lymphadenopathy Cardiovascular: RRR, No MRG Respiratory: CTA B Gastrointestinal: abdomen soft, NT, ND, BS+ Musculoskeletal: no deformities, strength intact in all 4 Skin: moist, warm, no rashes Neurological: no tremor with outstretched hands, DTR normal in all 4   Assessment & Plan:   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 His testosterone is now  533 on 200 mg of testosterone injection Every other week.   His CBC and hepatic panel are so far favorable for continued testosterone replacement.  I will  continue Testosterone 200mg  IM every 2 weeks , SE discussed with him. He will RTN in 4 months with repeat labs. His MRI is negative for sella/pituitary mass lesions. His PRL is normal at 13.3.   2. Vitamin D deficiency - His vitamin D is now full at 39, will continue with  vitamin D3 5000 units daily.    3. Prediabetes: - Due to his significant weight loss since last visit about 24 pounds, his A1c has improved to 5.1% from 6.1% .  He is not on any medications. I advised him to until you to engage in exercise and carbohydrates management plan.  4. Hypertension:  - Controlled With no medications , mainly due to recent significant weight loss. See above  - I advised patient to maintain close follow up with their PCP for primary care needs.  Follow up plan: Return in about 4 months (around 01/01/2017) for follow up with pre-visit labs.  Marquis Lunch, MD Phone: [phone]  Fax: [phone]   09/01/2016, 9:37 AM

## 2016-12-29 ENCOUNTER — Other Ambulatory Visit: Payer: Self-pay | Admitting: "Endocrinology

## 2016-12-29 LAB — COMPREHENSIVE METABOLIC PANEL
ALT: 16 U/L (ref 9–46)
AST: 16 U/L (ref 10–40)
Albumin: 4.6 g/dL (ref 3.6–5.1)
Alkaline Phosphatase: 43 U/L (ref 40–115)
BILIRUBIN TOTAL: 0.8 mg/dL (ref 0.2–1.2)
BUN: 18 mg/dL (ref 7–25)
CALCIUM: 9.3 mg/dL (ref 8.6–10.3)
CO2: 23 mmol/L (ref 20–31)
Chloride: 102 mmol/L (ref 98–110)
Creat: 1.08 mg/dL (ref 0.60–1.35)
GLUCOSE: 84 mg/dL (ref 65–99)
POTASSIUM: 4.3 mmol/L (ref 3.5–5.3)
Sodium: 143 mmol/L (ref 135–146)
Total Protein: 7.3 g/dL (ref 6.1–8.1)

## 2017-01-03 LAB — TESTOS,TOTAL,FREE AND SHBG (FEMALE)
SEX HORMONE BINDING GLOB.: 15 nmol/L (ref 10–50)
TESTOSTERONE,FREE: 55.1 pg/mL (ref 35.0–155.0)
Testosterone,Total,LC/MS/MS: 306 ng/dL (ref 250–1100)

## 2017-01-05 ENCOUNTER — Ambulatory Visit (INDEPENDENT_AMBULATORY_CARE_PROVIDER_SITE_OTHER): Payer: BLUE CROSS/BLUE SHIELD | Admitting: "Endocrinology

## 2017-01-05 ENCOUNTER — Encounter: Payer: Self-pay | Admitting: "Endocrinology

## 2017-01-05 VITALS — BP 121/82 | HR 67 | Ht 70.0 in | Wt 226.0 lb

## 2017-01-05 DIAGNOSIS — I10 Essential (primary) hypertension: Secondary | ICD-10-CM

## 2017-01-05 DIAGNOSIS — E291 Testicular hypofunction: Secondary | ICD-10-CM | POA: Diagnosis not present

## 2017-01-05 DIAGNOSIS — R7303 Prediabetes: Secondary | ICD-10-CM

## 2017-01-05 DIAGNOSIS — E559 Vitamin D deficiency, unspecified: Secondary | ICD-10-CM | POA: Diagnosis not present

## 2017-01-05 NOTE — Progress Notes (Signed)
Subjective:    Patient ID: Ian Robinson, male    DOB: 1975/12/23, PCP Patient, No Pcp Per   Past Medical History:  Diagnosis Date  . Kidney stone   . Scoliosis    Past Surgical History:  Procedure Laterality Date  . APPENDECTOMY    . arm fracture surgery     Social History   Social History  . Marital status: Married    Spouse name: N/A  . Number of children: N/A  . Years of education: N/A   Social History Main Topics  . Smoking status: Former Smoker    Quit date: 06/08/2007  . Smokeless tobacco: Never Used  . Alcohol use 3.6 oz/week    6 Cans of beer per week     Comment: occ 2 to 3 times a week  . Drug use: No  . Sexual activity: Not Asked   Other Topics Concern  . None   Social History Narrative  . None   Outpatient Encounter Prescriptions as of 01/05/2017  Medication Sig  . cetirizine (ZYRTEC) 10 MG tablet Take 10 mg by mouth daily.  . Syringe/Needle, Disp, (SYRINGE 3CC/20GX1") 20G X 1" 3 ML MISC Use to inject Testosterone 2 times a month  . testosterone cypionate (DEPOTESTOSTERONE CYPIONATE) 200 MG/ML injection Inject 1 mL (200 mg total) into the muscle every 14 (fourteen) days.   No facility-administered encounter medications on file as of 01/05/2017.    ALLERGIES: No Known Allergies VACCINATION STATUS:  There is no immunization history on file for this patient.  HPI  [name] is a 41 yr old male with medical hx as above.  he is here to f/u for hypogonadism with repeat labs. He was initiated on testosterone in 2016. - He was reinitiated on testosterone 200 mg IM every other week.  - He feels better. He is compliant.  -he did not father any children, saying that his wife has fertility problems. he was subjected to sperm analysis in the past when he was found to have "low sperm count and motility". he denies testicular injury, chemotherapy, radiation, nor STDs. He reiterated that they are not looking for fertility. His MRI is negative for  pituitary/sella mass lesion nor adenoma. he has remote ( at age 41) past face injury with no associated intracranial injury. His prolactin level was also normal, see below.  he has never taken androgens, steroids, nor opiates. he has new c/o frequent sinus infections.   Review of Systems  Constitutional:  + steady weight since last visit, no fatigue, no subjective hyperthermia/hypothermia Eyes: no blurry vision, no xerophthalmia ENT: no sore throat, no nodules palpated in throat, no dysphagia/odynophagia, no hoarseness Cardiovascular: no CP/SOB/palpitations/leg swelling Respiratory: no cough/SOB Gastrointestinal: no N/V/D/C Musculoskeletal: no muscle/joint aches Skin: no rashes Neurological: no tremors/numbness/tingling/dizziness Psychiatric: no depression/anxiety  Objective:    BP 121/82   Pulse 67   Ht 5\' 10"  (1.778 m)   Wt 226 lb (102.5 kg)   BMI 32.43 kg/m   Wt Readings from Last 3 Encounters:  01/05/17 226 lb (102.5 kg)  09/01/16 225 lb (102.1 kg)  06/02/16 219 lb (99.3 kg)    Physical Exam   Constitutional: obese, in NAD Eyes: PERRLA, EOMI, no exophthalmos ENT: moist mucous membranes, no thyromegaly, no cervical lymphadenopathy Cardiovascular: RRR, No MRG Respiratory: CTA B Gastrointestinal: abdomen soft, NT, ND, BS+ Musculoskeletal: no deformities, strength intact in all 4 Skin: moist, warm, no rashes Neurological: no tremor with outstretched hands, DTR normal in all 4  Assessment & Plan:   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 His testosterone is now  306 2 days before his injection on 200 mg of testosterone injection Every other week.   His CBC and hepatic panel are so far favorable for continued testosterone replacement.  I will  continue Testosterone 200mg  IM every 2 weeks , side effects discussed with him. He will RTN in 4 months with repeat labs. His MRI  is negative for sella/pituitary mass lesions. His PRL is normal at 13.3.   2. Vitamin D deficiency - His vitamin D is now full at 6733, will continue with  vitamin D3 5000 units daily.    3. Prediabetes: - Due to his significant weight loss prior to his last visit about 24 pounds, his A1c has improved to 5.1% from 6.1% .  He is not on any medications. I advised him to continue to engage in exercise and carbohydrates management plan.  4. Hypertension:  - Controlled With no medications , mainly due to recent significant weight loss. See above  - I advised patient to maintain close follow up with their PCP for primary care needs.  Follow up plan: Return in about 4 months (around 05/07/2017) for follow up with pre-visit labs.  Marquis LunchGebre Nida, MD Phone: [phone]  Fax: [phone]   01/05/2017, 11:21 AM

## 2017-05-01 IMAGING — DX DG ORBITS FOR FOREIGN BODY
2 series · 2 of 2 positions shown · non-contrast
Comparison: None.

CLINICAL DATA: Personal history of metallic foreign object in the
night. Machinist worker. Assess for MR safety.

EXAM:
ORBITS FOR FOREIGN BODY - 2 VIEW

[orbital waters (1 of 2)]
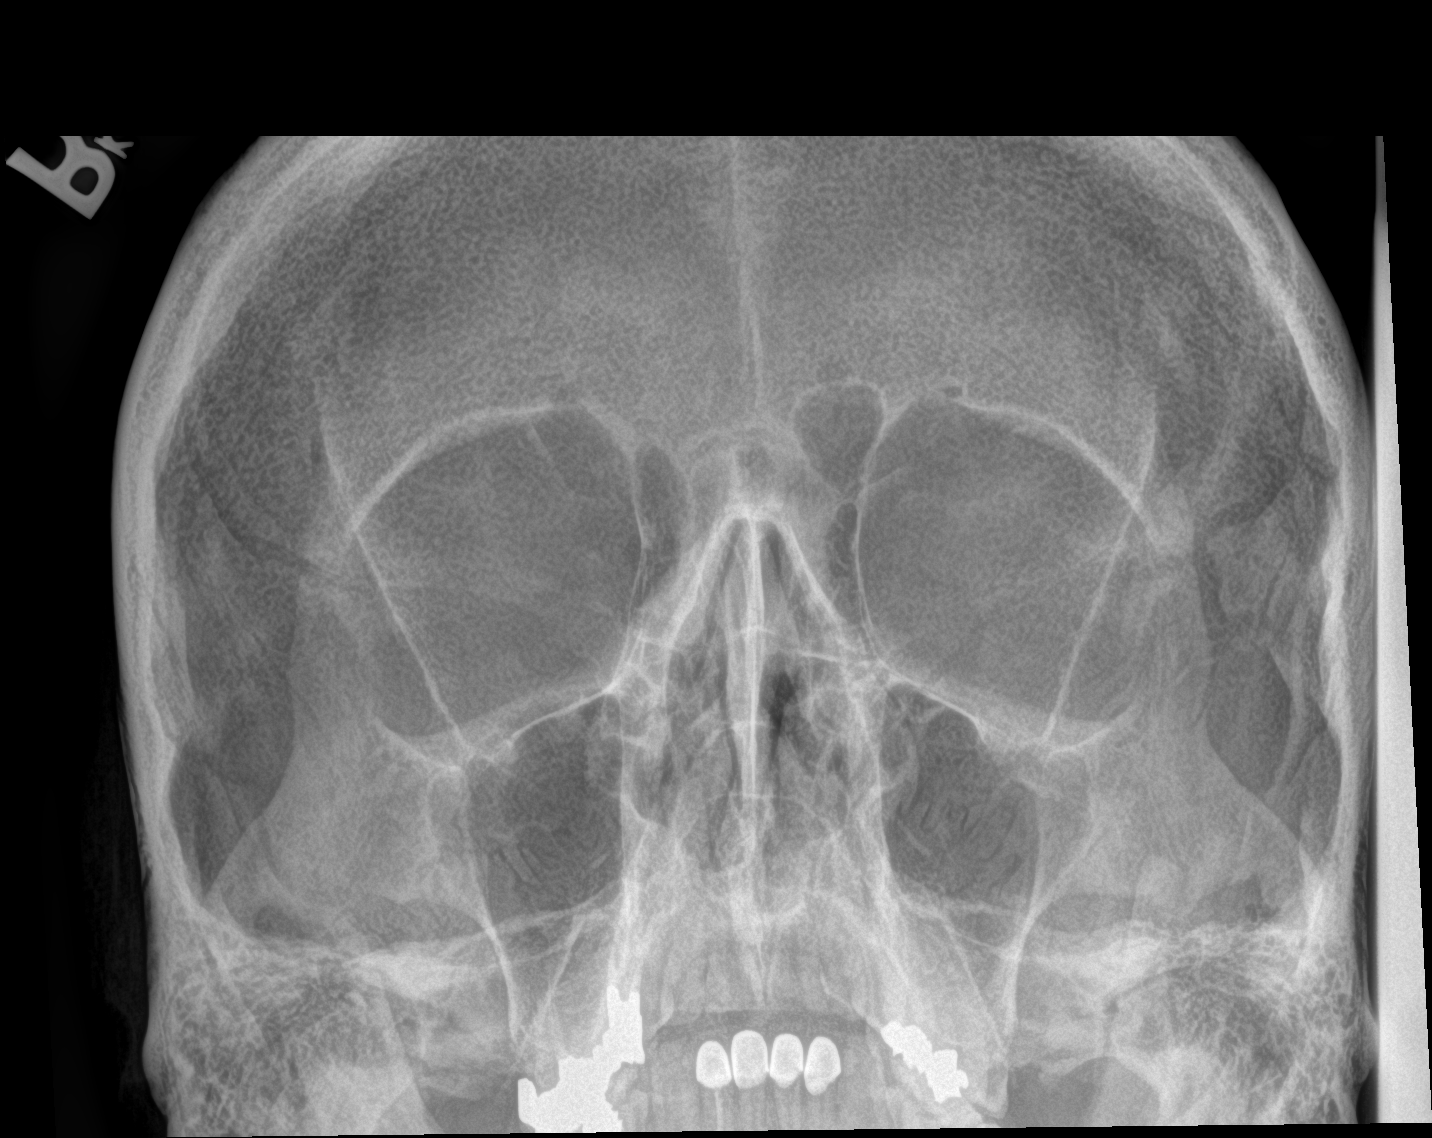

[orbital waters (2 of 2)]
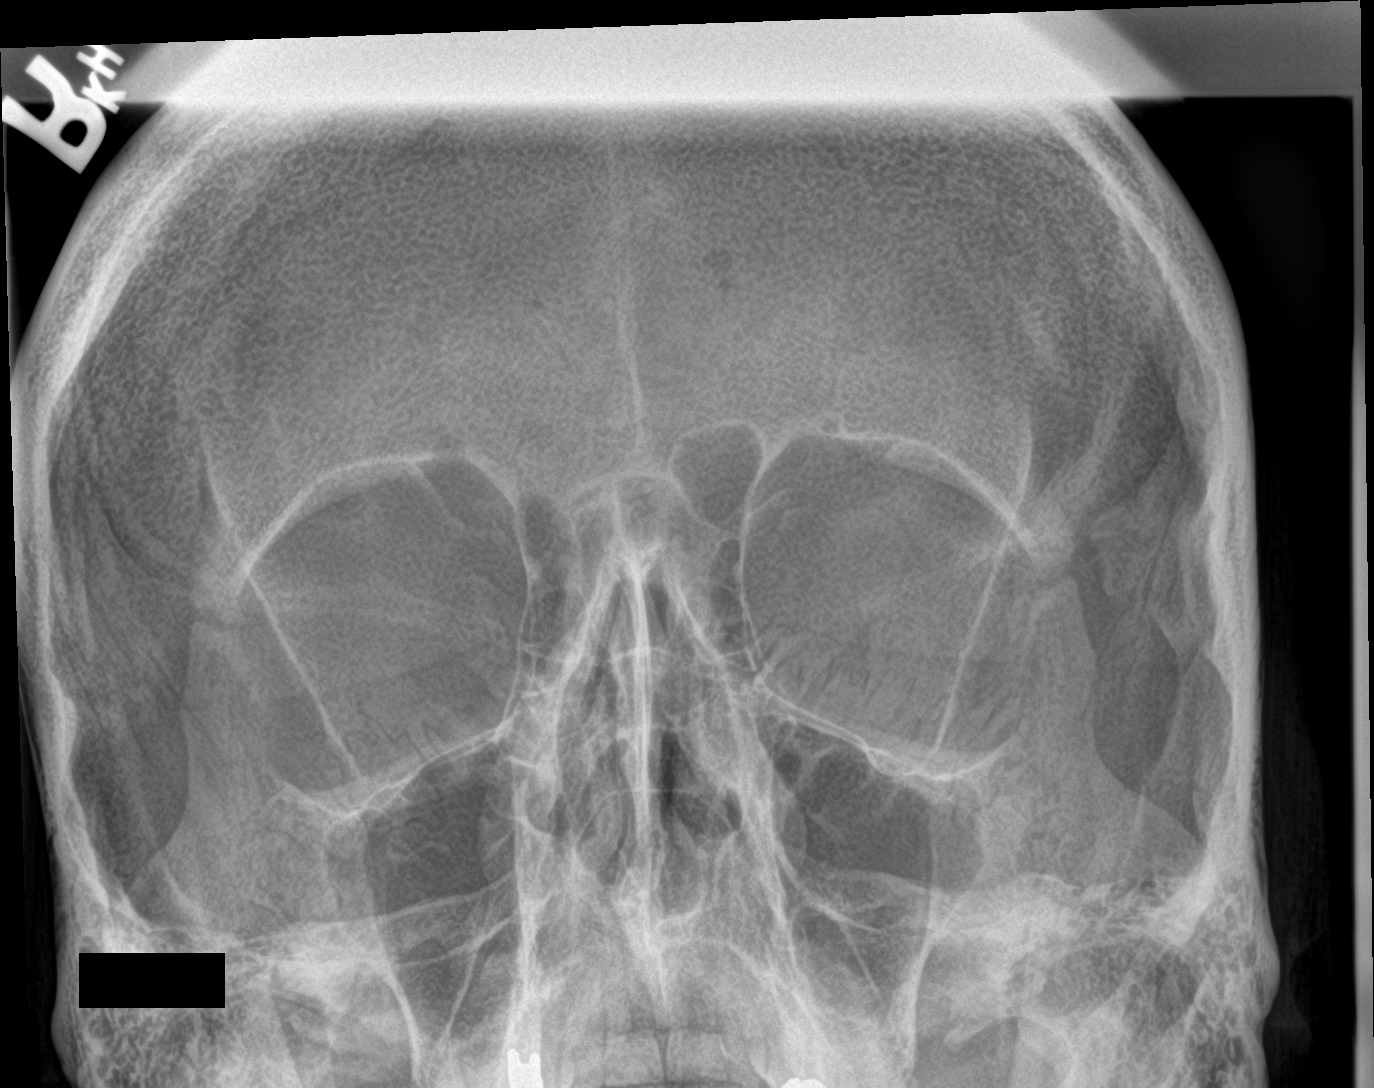

[2 of 2 positions shown; findings below may reference images not displayed]

FINDINGS: No radiopaque foreign object in the region of either orbit.
Visualized sinuses are clear.
IMPRESSION: Negative study.  Safe for MRI.

## 2017-05-05 LAB — CBC WITH DIFFERENTIAL/PLATELET
BASOS ABS: 69 {cells}/uL (ref 0–200)
Basophils Relative: 1.4 %
EOS PCT: 9 %
Eosinophils Absolute: 441 cells/uL (ref 15–500)
HEMATOCRIT: 45 % (ref 38.5–50.0)
HEMOGLOBIN: 15.4 g/dL (ref 13.2–17.1)
LYMPHS ABS: 1196 {cells}/uL (ref 850–3900)
MCH: 29.1 pg (ref 27.0–33.0)
MCHC: 34.2 g/dL (ref 32.0–36.0)
MCV: 84.9 fL (ref 80.0–100.0)
MPV: 9.6 fL (ref 7.5–12.5)
Monocytes Relative: 8.4 %
NEUTROS ABS: 2783 {cells}/uL (ref 1500–7800)
Neutrophils Relative %: 56.8 %
Platelets: 228 10*3/uL (ref 140–400)
RBC: 5.3 10*6/uL (ref 4.20–5.80)
RDW: 13.7 % (ref 11.0–15.0)
Total Lymphocyte: 24.4 %
WBC mixed population: 412 cells/uL (ref 200–950)
WBC: 4.9 10*3/uL (ref 3.8–10.8)

## 2017-05-05 LAB — PSA: PSA: 0.5 ng/mL (ref <=4.0)

## 2017-05-05 LAB — TESTOSTERONE TOTAL,FREE,BIO, MALES
Albumin: 4.7 g/dL (ref 3.6–5.1)
SEX HORMONE BINDING: 13 nmol/L (ref 10–50)
TESTOSTERONE BIOAVAILABLE: 185.2 ng/dL (ref >=110.0)
TESTOSTERONE FREE: 86.4 pg/mL (ref 46.0–224.0)
Testosterone: 358 ng/dL (ref 250–827)

## 2017-05-05 LAB — VITAMIN D 25 HYDROXY (VIT D DEFICIENCY, FRACTURES): VIT D 25 HYDROXY: 30 ng/mL (ref 30–100)

## 2017-05-11 ENCOUNTER — Encounter: Payer: Self-pay | Admitting: "Endocrinology

## 2017-05-11 ENCOUNTER — Ambulatory Visit (INDEPENDENT_AMBULATORY_CARE_PROVIDER_SITE_OTHER): Payer: BLUE CROSS/BLUE SHIELD | Admitting: "Endocrinology

## 2017-05-11 VITALS — BP 126/85 | HR 85 | Ht 70.0 in | Wt 215.0 lb

## 2017-05-11 DIAGNOSIS — R7303 Prediabetes: Secondary | ICD-10-CM | POA: Diagnosis not present

## 2017-05-11 DIAGNOSIS — I10 Essential (primary) hypertension: Secondary | ICD-10-CM

## 2017-05-11 DIAGNOSIS — E291 Testicular hypofunction: Secondary | ICD-10-CM

## 2017-05-11 DIAGNOSIS — E559 Vitamin D deficiency, unspecified: Secondary | ICD-10-CM

## 2017-05-11 NOTE — Progress Notes (Signed)
Subjective:    Patient ID: Ian Robinson, male    DOB: [birth year], PCP Patient, No Pcp Per   Past Medical History:  Diagnosis Date  . Kidney stone   . Scoliosis    Past Surgical History:  Procedure Laterality Date  . APPENDECTOMY    . arm fracture surgery     Social History   Socioeconomic History  . Marital status: Married    Spouse name: None  . Number of children: None  . Years of education: None  . Highest education level: None  Social Needs  . Financial resource strain: None  . Food insecurity - worry: None  . Food insecurity - inability: None  . Transportation needs - medical: None  . Transportation needs - non-medical: None  Occupational History  . None  Tobacco Use  . Smoking status: Former Smoker    Last attempt to quit: 06/08/2007    Years since quitting: 9.9  . Smokeless tobacco: Never Used  Substance and Sexual Activity  . Alcohol use: Yes    Alcohol/week: 3.6 oz    Types: 6 Cans of beer per week    Comment: occ 2 to 3 times a week  . Drug use: No  . Sexual activity: None  Other Topics Concern  . None  Social History Narrative  . None   Outpatient Encounter Medications as of 05/11/2017  Medication Sig  . cetirizine (ZYRTEC) 10 MG tablet Take 10 mg by mouth daily.  . diclofenac (VOLTAREN) 75 MG EC tablet 2 (two) times daily.  Marland Kitchen oxyCODONE-acetaminophen (PERCOCET/ROXICET) 5-325 MG tablet every 6 (six) hours as needed.  . Syringe/Needle, Disp, (SYRINGE 3CC/20GX1") 20G X 1" 3 ML MISC Use to inject Testosterone 2 times a month  . testosterone cypionate (DEPOTESTOSTERONE CYPIONATE) 200 MG/ML injection Inject 1 mL (200 mg total) into the muscle every 14 (fourteen) days.   No facility-administered encounter medications on file as of 05/11/2017.    ALLERGIES: No Known Allergies VACCINATION STATUS:  There is no immunization history on file for this patient.  HPI  Ian Robinson is a 41 yr old male with medical hx as above.  he is here to f/u for  hypogonadism with repeat labs. - He is on testosterone 200 mg IM every other week.  - He feels better. He is compliant.  -he did not father any children, saying that his wife has fertility problems. he was subjected to sperm analysis in the past when he was found to have "low sperm count and motility". he denies testicular injury, chemotherapy, radiation, nor STDs. He reiterated that they are not looking for fertility. His MRI is negative for pituitary/sella mass lesion nor adenoma. he has remote ( at age 38) past face injury with no associated intracranial injury. His prolactin level was also normal, see below.  he has never taken androgens, steroids, nor opiates. he has new c/o frequent sinus infections.   Review of Systems  Constitutional:  + steady weight loss of 10 lbs since last visit, no fatigue, no subjective hyperthermia/hypothermia Eyes: no blurry vision, no xerophthalmia ENT: no sore throat, no nodules palpated in throat, no dysphagia/odynophagia, no hoarseness Cardiovascular: no CP/SOB/palpitations/leg swelling Respiratory: no cough/SOB Gastrointestinal: no N/V/D/C Musculoskeletal: no muscle/joint aches Skin: no rashes Neurological: no tremors/numbness/tingling/dizziness Psychiatric: no depression/anxiety  Objective:    BP 126/85   Pulse 85   Ht 5\' 10"  (1.778 m)   Wt 215 lb (97.5 kg)   BMI 30.85 kg/m   Wt Readings from Last  3 Encounters:  05/11/17 215 lb (97.5 kg)  01/05/17 226 lb (102.5 kg)  09/01/16 225 lb (102.1 kg)    Physical Exam   Constitutional: obese, in NAD Eyes: PERRLA, EOMI, no exophthalmos ENT: moist mucous membranes, no thyromegaly, no cervical lymphadenopathy Cardiovascular: RRR, No MRG Respiratory: CTA B Gastrointestinal: abdomen soft, NT, ND, BS+ Musculoskeletal: no deformities, strength intact in all 4 Skin: moist, warm, no rashes Neurological: no tremor with outstretched hands, DTR normal in all 4  Recent Results (from the past 2160  hour(s))  CBC with Differential/Platelet     Status: None   Collection Time: 05/04/17  8:57 AM  Result Value Ref Range   WBC 4.9 3.8 - 10.8 Thousand/uL   RBC 5.30 4.20 - 5.80 Million/uL   Hemoglobin 15.4 13.2 - 17.1 g/dL   HCT 40.945.0 81.138.5 - 91.450.0 %   MCV 84.9 80.0 - 100.0 fL   MCH 29.1 27.0 - 33.0 pg   MCHC 34.2 32.0 - 36.0 g/dL   RDW 78.213.7 95.611.0 - 21.315.0 %   Platelets 228 140 - 400 Thousand/uL   MPV 9.6 7.5 - 12.5 fL   Neutro Abs 2,783 1,500 - 7,800 cells/uL   Lymphs Abs 1,196 850 - 3,900 cells/uL   WBC mixed population 412 200 - 950 cells/uL   Eosinophils Absolute 441 15 - 500 cells/uL   Basophils Absolute 69 0 - 200 cells/uL   Neutrophils Relative % 56.8 %   Total Lymphocyte 24.4 %   Monocytes Relative 8.4 %   Eosinophils Relative 9.0 %   Basophils Relative 1.4 %  PSA     Status: None   Collection Time: 05/04/17  8:57 AM  Result Value Ref Range   PSA 0.5 < OR = 4.0 ng/mL    Comment: The total PSA value from this assay system is  standardized against the WHO standard. The test  result will be approximately 20% lower when compared  to the equimolar-standardized total PSA (Beckman  Coulter). Comparison of serial PSA results should be  interpreted with this fact in mind. . This test was performed using the Siemens  chemiluminescent method. Values obtained from  different assay methods cannot be used interchangeably. PSA levels, regardless of value, should not be interpreted as absolute evidence of the presence or absence of disease.   VITAMIN D 25 Hydroxy (Vit-D Deficiency, Fractures)     Status: None   Collection Time: 05/04/17  8:57 AM  Result Value Ref Range   Vit D, 25-Hydroxy 30 30 - 100 ng/mL    Comment: Vitamin D Status         25-OH Vitamin D: . Deficiency:                    <20 ng/mL Insufficiency:             20 - 29 ng/mL Optimal:                 > or = 30 ng/mL . For 25-OH Vitamin D testing on patients on  D2-supplementation and patients for whom quantitation   of D2 and D3 fractions is required, the QuestAssureD(TM) 25-OH VIT D, (D2,D3), LC/MS/MS is recommended: order  code [id number] (patients >7574yrs). . 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 His testosterone is now  358  on 200 mg of testosterone injection Every other week.   His CBC and hepatic panel are so far favorable for continued testosterone replacement.  I will  continue Testosterone 200mg  IM every 2 weeks , side effects discussed with him. He will RTN in 4 months with repeat labs. His MRI is negative for sella/pituitary mass lesions. His PRL is normal at 13.3.   2. Vitamin D deficiency - His vitamin D is now full at 30, will continue with  vitamin D3 5000 units daily.    3. Prediabetes: - Due to his  Recent significant weight loss, his A1c has improved to 5.1% from 6.1% .  He is not on any medications. I advised him to continue to engage in exercise and carbohydrates management plan. -  Suggestion is made for him to avoid simple carbohydrates  from his diet including Cakes, Sweet Desserts / Pastries, Ice Cream, Soda (diet and regular), Sweet Tea, Candies, Chips, Cookies, Store Bought Juices, Alcohol in Excess of  1-2 drinks a day, Artificial Sweeteners, and "Sugar-free" Products. This will help patient to have stable blood glucose profile and potentially avoid unintended weight  gain.  4. Hypertension:  - Controlled with no medications , mainly due to recent significant weight loss. See above  - I advised patient to maintain close follow up with his PCP for primary care needs.  Follow up plan: Return in about 4 months (around 09/09/2017) for follow up with pre-visit labs.  Ian LunchGebre Pride Gonzales, MD Phone: [phone]  Fax: [phone]   -  This note was partially dictated with voice recognition software. Similar sounding words can be transcribed inadequately or may not  be corrected upon review.  05/11/2017, 1:34 PM

## 2017-07-14 ENCOUNTER — Telehealth: Payer: Self-pay | Admitting: "Endocrinology

## 2017-07-14 ENCOUNTER — Other Ambulatory Visit: Payer: Self-pay

## 2017-07-14 MED ORDER — TESTOSTERONE CYPIONATE 200 MG/ML IM SOLN: 200.0000 mg | INTRAMUSCULAR | 0 refills | Status: DC

## 2017-07-14 NOTE — Telephone Encounter (Signed)
Rx faxed

## 2017-07-14 NOTE — Telephone Encounter (Signed)
Ian Robinson is asking for a refill on testosterone cypionate (DEPOTESTOSTERONE CYPIONATE) 200 MG/ML  Sent to [facility]

## 2017-09-26 LAB — HEMOGLOBIN A1C
EAG (MMOL/L): 6 (calc)
Hgb A1c MFr Bld: 5.4 % of total Hgb (ref <5.7)
Mean Plasma Glucose: 108 (calc)

## 2017-09-26 LAB — TESTOS,TOTAL,FREE AND SHBG (FEMALE)
Free Testosterone: 116.4 pg/mL (ref 35.0–155.0)
SEX HORMONE BINDING: 10 nmol/L (ref 10–50)
TESTOSTERONE, TOTAL, LC-MS-MS: 446 ng/dL (ref 250–1100)

## 2017-09-26 LAB — CBC WITH DIFFERENTIAL/PLATELET
BASOS PCT: 1 %
Basophils Absolute: 59 cells/uL (ref 0–200)
Eosinophils Absolute: 389 cells/uL (ref 15–500)
Eosinophils Relative: 6.6 %
HEMATOCRIT: 45 % (ref 38.5–50.0)
Hemoglobin: 15.9 g/dL (ref 13.2–17.1)
LYMPHS ABS: 1351 {cells}/uL (ref 850–3900)
MCH: 30.1 pg (ref 27.0–33.0)
MCHC: 35.3 g/dL (ref 32.0–36.0)
MCV: 85.1 fL (ref 80.0–100.0)
MPV: 9.1 fL (ref 7.5–12.5)
Monocytes Relative: 8.3 %
NEUTROS PCT: 61.2 %
Neutro Abs: 3611 cells/uL (ref 1500–7800)
PLATELETS: 204 10*3/uL (ref 140–400)
RBC: 5.29 10*6/uL (ref 4.20–5.80)
RDW: 13.5 % (ref 11.0–15.0)
Total Lymphocyte: 22.9 %
WBC mixed population: 490 cells/uL (ref 200–950)
WBC: 5.9 10*3/uL (ref 3.8–10.8)

## 2017-09-30 ENCOUNTER — Ambulatory Visit (INDEPENDENT_AMBULATORY_CARE_PROVIDER_SITE_OTHER): Payer: BLUE CROSS/BLUE SHIELD | Admitting: "Endocrinology

## 2017-09-30 ENCOUNTER — Encounter: Payer: Self-pay | Admitting: "Endocrinology

## 2017-09-30 VITALS — BP 119/87 | HR 105 | Ht 70.0 in | Wt 216.0 lb

## 2017-09-30 DIAGNOSIS — I10 Essential (primary) hypertension: Secondary | ICD-10-CM

## 2017-09-30 DIAGNOSIS — R7303 Prediabetes: Secondary | ICD-10-CM

## 2017-09-30 DIAGNOSIS — E291 Testicular hypofunction: Secondary | ICD-10-CM | POA: Diagnosis not present

## 2017-09-30 MED ORDER — TESTOSTERONE CYPIONATE 200 MG/ML IM SOLN: 200.0000 mg | INTRAMUSCULAR | 0 refills | Status: DC

## 2017-09-30 MED ORDER — "SYRINGE 20G X 1"" 3 ML MISC": 0 refills | Status: DC

## 2017-09-30 NOTE — Progress Notes (Signed)
Subjective:    Patient ID: Ian Robinson, male    DOB: Aug 10, 1975, PCP Patient, No Pcp Per   Past Medical History:  Diagnosis Date  . Kidney stone   . Scoliosis    Past Surgical History:  Procedure Laterality Date  . APPENDECTOMY    . arm fracture surgery     Social History   Socioeconomic History  . Marital status: Married    Spouse name: Not on file  . Number of children: Not on file  . Years of education: Not on file  . Highest education level: Not on file  Occupational History  . Not on file  Social Needs  . Financial resource strain: Not on file  . Food insecurity:    Worry: Not on file    Inability: Not on file  . Transportation needs:    Medical: Not on file    Non-medical: Not on file  Tobacco Use  . Smoking status: Former Smoker    Last attempt to quit: 06/08/2007    Years since quitting: 10.3  . Smokeless tobacco: Never Used  Substance and Sexual Activity  . Alcohol use: Yes    Alcohol/week: 3.6 oz    Types: 6 Cans of beer per week    Comment: occ 2 to 3 times a week  . Drug use: No  . Sexual activity: Not on file  Lifestyle  . Physical activity:    Days per week: Not on file    Minutes per session: Not on file  . Stress: Not on file  Relationships  . Social connections:    Talks on phone: Not on file    Gets together: Not on file    Attends religious service: Not on file    Active member of club or organization: Not on file    Attends meetings of clubs or organizations: Not on file    Relationship status: Not on file  Other Topics Concern  . Not on file  Social History Narrative  . Not on file   Outpatient Encounter Medications as of 09/30/2017  Medication Sig  . cetirizine (ZYRTEC) 10 MG tablet Take 10 mg by mouth daily.  . diclofenac (VOLTAREN) 75 MG EC tablet 2 (two) times daily.  . Syringe/Needle, Disp, (SYRINGE 3CC/20GX1") 20G X 1" 3 ML MISC Use to inject Testosterone 2 times a month  . testosterone cypionate (DEPOTESTOSTERONE  CYPIONATE) 200 MG/ML injection Inject 1 mL (200 mg total) into the muscle every 14 (fourteen) days.  . [DISCONTINUED] oxyCODONE-acetaminophen (PERCOCET/ROXICET) 5-325 MG tablet every 6 (six) hours as needed.  . [DISCONTINUED] Syringe/Needle, Disp, (SYRINGE 3CC/20GX1") 20G X 1" 3 ML MISC Use to inject Testosterone 2 times a month  . [DISCONTINUED] testosterone cypionate (DEPOTESTOSTERONE CYPIONATE) 200 MG/ML injection Inject 1 mL (200 mg total) into the muscle every 14 (fourteen) days.   No facility-administered encounter medications on file as of 09/30/2017.    ALLERGIES: No Known Allergies VACCINATION STATUS:  There is no immunization history on file for this patient.  HPI  Mr. Ian PicketScott is a 42 yr old male with medical hx as above.  -He is here to follow-up for hypogonadism with repeat labs.  He is currently on testosterone 200 mg IM every other week.  He feels better, reports compliance to his medication.   -He did not father any children, saying that his wife has fertility problems. He was subjected to sperm analysis in the past when he was found to have "low sperm count and  motility". He denies testicular injury, chemotherapy, radiation, nor STDs. He reiterated that they are not looking for fertility. His MRI is negative for pituitary/sella mass lesion nor adenoma. he has remote ( at age 31) past face injury with no associated intracranial injury. His prolactin level was also normal, see below.  He has never taken androgens, steroids, nor opiates. he has new c/o frequent sinus infections.   Review of Systems  Constitutional:  + steady weight after loss of 10 lbs, no fatigue, no subjective hyperthermia/hypothermia Eyes: no blurry vision, no xerophthalmia ENT: no sore throat, no nodules palpated in throat, no dysphagia/odynophagia, no hoarseness Musculoskeletal: no muscle/joint aches Skin: no rashes Neurological: no tremors/numbness/tingling/dizziness Psychiatric: no  depression/anxiety  Objective:    BP 119/87   Pulse (!) 105   Ht 5\' 10"  (1.778 m)   Wt 216 lb (98 kg)   BMI 30.99 kg/m   Wt Readings from Last 3 Encounters:  09/30/17 216 lb (98 kg)  05/11/17 215 lb (97.5 kg)  01/05/17 226 lb (102.5 kg)    Physical Exam   Constitutional: + obese with BMI of 30.99, not in acute distress.   Eyes: PERRLA, EOMI, no exophthalmos ENT: moist mucous membranes, no thyromegaly, no cervical lymphadenopathy Musculoskeletal: no deformities, strength intact in all 4 Skin: moist, warm, no rashes Neurological: no tremor with outstretched hands, DTR normal in all 4  Recent Results (from the past 2160 hour(s))  Hemoglobin A1c     Status: None   Collection Time: 09/21/17  8:51 AM  Result Value Ref Range   Hgb A1c MFr Bld 5.4 <5.7 % of total Hgb    Comment: For the purpose of screening for the presence of diabetes: . <5.7%       Consistent with the absence of diabetes 5.7-6.4%    Consistent with increased risk for diabetes             (prediabetes) > or =6.5%  Consistent with diabetes . This assay result is consistent with a decreased risk of diabetes. . Currently, no consensus exists regarding use of hemoglobin A1c for diagnosis of diabetes in children. . According to American Diabetes Association (ADA) guidelines, hemoglobin A1c <7.0% represents optimal control in non-pregnant diabetic patients. Different metrics may apply to specific patient populations.  Standards of Medical Care in Diabetes(ADA). .    Mean Plasma Glucose 108 (calc)   eAG (mmol/L) 6.0 (calc)  CBC with Differential/Platelet     Status: None   Collection Time: 09/21/17  8:51 AM  Result Value Ref Range   WBC 5.9 3.8 - 10.8 Thousand/uL   RBC 5.29 4.20 - 5.80 Million/uL   Hemoglobin 15.9 13.2 - 17.1 g/dL   HCT 11.9 14.7 - 82.9 %   MCV 85.1 80.0 - 100.0 fL   MCH 30.1 27.0 - 33.0 pg   MCHC 35.3 32.0 - 36.0 g/dL   RDW 56.2 13.0 - 86.5 %   Platelets 204 140 - 400 Thousand/uL    MPV 9.1 7.5 - 12.5 fL   Neutro Abs 3,611 1,500 - 7,800 cells/uL   Lymphs Abs 1,351 850 - 3,900 cells/uL   WBC mixed population 490 200 - 950 cells/uL   Eosinophils Absolute 389 15 - 500 cells/uL   Basophils Absolute 59 0 - 200 cells/uL   Neutrophils Relative % 61.2 %   Total Lymphocyte 22.9 %   Monocytes Relative 8.3 %   Eosinophils Relative 6.6 %   Basophils Relative 1.0 %  Testos,Total,Free and SHBG (Male)  Status: None   Collection Time: 09/21/17  8:51 AM  Result Value Ref Range   Testosterone, Total, LC-MS-MS 446 250 - 1,100 ng/dL    Comment: . For additional information, please refer to http://education.questdiagnostics.com/faq/ TotalTestosteroneLCMSMSFAQ165 (This link is being provided for informational/ educational purposes only.) . This test was developed and its analytical performance characteristics have been determined by [facility] [place], [place]. It has not been cleared or approved by the U.S. Food and Drug Administration. This assay has been validated pursuant to the CLIA regulations and is used for clinical purposes. .    Free Testosterone 116.4 35.0 - 155.0 pg/mL    Comment: . This test was developed and its analytical performance characteristics have been determined by [facility], [place]. It has not been cleared or approved by the U.S. Food and Drug Administration. This assay has been validated pursuant to the CLIA regulations and is used for clinical purposes. .    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 His testosterone is now 446  on 200 mg of testosterone injection Every other week.  -He wishes to continue to get testosterone replacement.  His CBC and hepatic panel are so far favorable for continued testosterone replacement.   I discussed and continued  Testosterone 200mg  IM every 2 weeks , side effects discussed with him. He will RTN in 4 months with repeat labs. His MRI is negative for sella/pituitary mass lesions. His PRL is normal at 13.3.  2. Prediabetes: - Due to his  Recent significant weight loss, his A1c has improved to 5.4% from 6.1% .  He is not on any medications. I advised him to continue to engage in exercise and carbohydrates management plan. -  Suggestion is made for him to avoid simple carbohydrates  from his diet including Cakes, Sweet Desserts / Pastries, Ice Cream, Soda (diet and regular), Sweet Tea, Candies, Chips, Cookies, Store Bought Juices, Alcohol in Excess of  1-2 drinks a day, Artificial Sweeteners, and "Sugar-free" Products. This will help patient to have stable blood glucose profile and potentially avoid unintended weight gain.   3. Hypertension:  - Controlled with no medications , mainly due to recent significant weight loss. See above  - I advised patient to maintain close follow up with his PCP for primary care needs.  Follow up plan: Return in about 4 months (around 01/30/2018) for follow up with pre-visit labs.  Marquis Lunch, MD Phone: [phone]  Fax: [phone]   -  This note was partially dictated with voice recognition software. Similar sounding words can be transcribed inadequately or may not  be corrected upon review.  09/30/2017, 10:35 AM

## 2018-01-17 ENCOUNTER — Encounter: Payer: Self-pay | Admitting: "Endocrinology

## 2018-01-27 LAB — HEPATIC FUNCTION PANEL
AG Ratio: 1.8 (calc) (ref 1.0–2.5)
ALBUMIN MSPROF: 4.6 g/dL (ref 3.6–5.1)
ALT: 18 U/L (ref 9–46)
AST: 17 U/L (ref 10–40)
Alkaline phosphatase (APISO): 39 U/L — ABNORMAL LOW (ref 40–115)
BILIRUBIN DIRECT: 0.2 mg/dL (ref 0.0–0.2)
BILIRUBIN INDIRECT: 0.7 mg/dL (ref 0.2–1.2)
BILIRUBIN TOTAL: 0.9 mg/dL (ref 0.2–1.2)
Globulin: 2.5 g/dL (calc) (ref 1.9–3.7)
Total Protein: 7.1 g/dL (ref 6.1–8.1)

## 2018-01-27 LAB — TESTOSTERONE TOTAL,FREE,BIO, MALES
ALBUMIN MSPROF: 4.6 g/dL (ref 3.6–5.1)
SEX HORMONE BINDING: 16 nmol/L (ref 10–50)
TESTOSTERONE BIOAVAILABLE: 138.4 ng/dL (ref >=110.0)
TESTOSTERONE FREE: 65.9 pg/mL (ref 46.0–224.0)
Testosterone: 310 ng/dL (ref 250–827)

## 2018-01-27 LAB — PSA: PSA: 0.6 ng/mL (ref <=4.0)

## 2018-02-03 ENCOUNTER — Ambulatory Visit: Payer: BLUE CROSS/BLUE SHIELD | Admitting: "Endocrinology

## 2018-02-15 ENCOUNTER — Ambulatory Visit (INDEPENDENT_AMBULATORY_CARE_PROVIDER_SITE_OTHER): Payer: BLUE CROSS/BLUE SHIELD | Admitting: "Endocrinology

## 2018-02-15 ENCOUNTER — Encounter: Payer: Self-pay | Admitting: "Endocrinology

## 2018-02-15 VITALS — BP 131/84 | HR 66 | Ht 70.0 in | Wt 226.0 lb

## 2018-02-15 DIAGNOSIS — I10 Essential (primary) hypertension: Secondary | ICD-10-CM | POA: Diagnosis not present

## 2018-02-15 DIAGNOSIS — R7303 Prediabetes: Secondary | ICD-10-CM

## 2018-02-15 DIAGNOSIS — E291 Testicular hypofunction: Secondary | ICD-10-CM

## 2018-02-15 MED ORDER — TESTOSTERONE CYPIONATE 200 MG/ML IM SOLN: 200.0000 mg | INTRAMUSCULAR | 0 refills | Status: DC

## 2018-02-15 NOTE — Progress Notes (Signed)
Endocrinology follow-up note   Subjective:    Patient ID: Ian Robinson, male    DOB: 25-Jan-1976, PCP Patient, No Pcp Per   Past Medical History:  Diagnosis Date  . Kidney stone   . Scoliosis    Past Surgical History:  Procedure Laterality Date  . APPENDECTOMY    . arm fracture surgery     Social History   Socioeconomic History  . Marital status: Married    Spouse name: Not on file  . Number of children: Not on file  . Years of education: Not on file  . Highest education level: Not on file  Occupational History  . Not on file  Social Needs  . Financial resource strain: Not on file  . Food insecurity:    Worry: Not on file    Inability: Not on file  . Transportation needs:    Medical: Not on file    Non-medical: Not on file  Tobacco Use  . Smoking status: Former Smoker    Last attempt to quit: 06/08/2007    Years since quitting: 10.6  . Smokeless tobacco: Never Used  Substance and Sexual Activity  . Alcohol use: Yes    Alcohol/week: 6.0 standard drinks    Types: 6 Cans of beer per week    Comment: occ 2 to 3 times a week  . Drug use: No  . Sexual activity: Not on file  Lifestyle  . Physical activity:    Days per week: Not on file    Minutes per session: Not on file  . Stress: Not on file  Relationships  . Social connections:    Talks on phone: Not on file    Gets together: Not on file    Attends religious service: Not on file    Active member of club or organization: Not on file    Attends meetings of clubs or organizations: Not on file    Relationship status: Not on file  Other Topics Concern  . Not on file  Social History Narrative  . Not on file   Outpatient Encounter Medications as of 02/15/2018  Medication Sig  . cetirizine (ZYRTEC) 10 MG tablet Take 10 mg by mouth daily.  . diclofenac (VOLTAREN) 75 MG EC tablet 2 (two) times daily.  . montelukast (SINGULAIR) 10 MG tablet daily as needed.  . Syringe/Needle, Disp, (SYRINGE 3CC/20GX1") 20G X 1" 3  ML MISC Use to inject Testosterone 2 times a month  . testosterone cypionate (DEPOTESTOSTERONE CYPIONATE) 200 MG/ML injection Inject 1 mL (200 mg total) into the muscle every 14 (fourteen) days.  . [DISCONTINUED] testosterone cypionate (DEPOTESTOSTERONE CYPIONATE) 200 MG/ML injection Inject 1 mL (200 mg total) into the muscle every 14 (fourteen) days.   No facility-administered encounter medications on file as of 02/15/2018.    ALLERGIES: No Known Allergies VACCINATION STATUS:  There is no immunization history on file for this patient.  HPI  [name] is a 42 year old male with medical history as above.    -He is here to follow-up for hypogonadism with repeat labs.  He is currently on testosterone 200 mg IM every other week.  He feels better, reports compliance to his medication.   -He did not father any children, saying that his wife has fertility problems. He was subjected to sperm analysis in the past when he was found to have "low sperm count and motility". He denies testicular injury, chemotherapy, radiation, nor STDs. He reiterated that they are not looking for fertility. His MRI  is negative for pituitary/sella mass lesion nor adenoma. he has remote ( at age 45) past face injury with no associated intracranial injury. His prolactin level was also normal, see below.  He has never taken androgens, steroids, nor opiates. he has new c/o frequent sinus infections.   Review of Systems  Constitutional:  + steady weight gain of 10 pounds since last visit, no fatigue, no subjective hyperthermia/hypothermia Eyes: no blurry vision, no xerophthalmia ENT: no sore throat, no nodules palpated in throat, no dysphagia/odynophagia, no hoarseness Musculoskeletal: +History of left knee injury with significant pain, currently planning to undergo left knee replacement.    Skin: no rashes Neurological: no tremors/numbness/tingling/dizziness Psychiatric: no depression/anxiety  Objective:    BP  131/84   Pulse 66   Ht 5\' 10"  (1.778 m)   Wt 226 lb (102.5 kg)   BMI 32.43 kg/m   Wt Readings from Last 3 Encounters:  02/15/18 226 lb (102.5 kg)  09/30/17 216 lb (98 kg)  05/11/17 215 lb (97.5 kg)    Physical Exam   Constitutional: + obese with BMI of 32, not in acute distress.   Eyes: PERRLA, EOMI, no exophthalmos ENT: moist mucous membranes, no thyromegaly, no cervical lymphadenopathy Musculoskeletal: no deformities, strength intact in all 4 Skin: moist, warm, no rashes Neurological: no tremor with outstretched hands   Recent Results (from the past 2160 hour(s))  Testosterone Total,Free,Bio, Males     Status: None   Collection Time: 01/25/18  9:37 AM  Result Value Ref Range   Testosterone 310 250 - 827 ng/dL   Albumin 4.6 3.6 - 5.1 g/dL   Sex Hormone Binding 16 10 - 50 nmol/L   Testosterone, Free 65.9 46.0 - 224.0 pg/mL   Testosterone, Bioavailable 138.4 110.0 - 575 ng/dL  PSA     Status: None   Collection Time: 01/25/18  9:37 AM  Result Value Ref Range   PSA 0.6 < OR = 4.0 ng/mL    Comment: The total PSA value from this assay system is  standardized against the WHO standard. The test  result will be approximately 20% lower when compared  to the equimolar-standardized total PSA (Beckman  Coulter). Comparison of serial PSA results should be  interpreted with this fact in mind. . This test was performed using the Siemens  chemiluminescent method. Values obtained from  different assay methods cannot be used interchangeably. PSA levels, regardless of value, should not be interpreted as absolute evidence of the presence or absence of disease.   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 His total testosterone at 310 2 days before his injection.   -This is acceptable range for testosterone replacement. -He wishes to continue to get testosterone replacement.  His  CBC and hepatic panel are so far favorable for continued testosterone replacement.  I discussed and continued  Testosterone 200mg  IM every 2 weeks , side effects discussed with him. He will RTN in 4 months with repeat labs. His MRI is negative for sella/pituitary mass lesions. His PRL is normal at 13.3.  2. Prediabetes: -Prior to his last visit A1c was 5.4% on September 21, 2017.  He will have a repeat A1c and vitamin D before his next visit.    -  Suggestion is made for him to avoid simple carbohydrates  from his diet including Cakes, Sweet Desserts / Pastries, Ice Cream, Soda (diet and regular), Sweet Tea, Candies, Chips, Cookies, Store Bought Juices, Alcohol in Excess of  1-2 drinks a day, Artificial Sweeteners, and "Sugar-free" Products. This will help patient to have stable blood glucose profile and potentially avoid unintended weight gain.   3. Hypertension:  - Controlled with no medications , mainly due to recent significant weight loss. See above  - I advised patient to maintain close follow up with his PCP for primary care needs.  Follow up plan: Return in about 4 months (around 06/17/2018) for Follow up with Pre-visit Labs.  Marquis Lunch, MD Phone: [phone]  Fax: [phone]   -  This note was partially dictated with voice recognition software. Similar sounding words can be transcribed inadequately or may not  be corrected upon review.  02/15/2018, 5:18 PM

## 2018-02-28 NOTE — Progress Notes (Signed)
02/16/2018- Medical Clearance on chart from Francine GravenNichole Conner, FNP , EKG, and labs-HgA1C, U/A micro, CMP, Lipid panel, Liver panel, CBC w/diff.  02/15/2018[facility]

## 2018-02-28 NOTE — Patient Instructions (Addendum)
Ian Robinson  02/28/2018   Your procedure is scheduled on: Wednesday 03/08/2018  Report to [facility] Main  Entrance              Report to admitting at  0630  AM    Call this number if you have problems the morning of surgery [phone]    Remember: Do not eat food or drink liquids :After Midnight.   BRUSH YOUR TEETH MORNING OF SURGERY AND RINSE YOUR MOUTH OUT, NO CHEWING GUM CANDY OR MINTS.     Take these medicines the morning of surgery with A SIP OF WATER: Zyrtec if needed, use Pro-Air inhaler if needed and bring inhaler with you to the hospital                                You may not have any metal on your body including hair pins and              piercings  Do not wear jewelry, make-up, lotions, powders or perfumes, deodorant                         Men may shave face and neck.   Do not bring valuables to the hospital. [facility] IS NOT             RESPONSIBLE   FOR VALUABLES.  Contacts, dentures or bridgework may not be worn into surgery.  Leave suitcase in the car. After surgery it may be brought to your room.                  Please read over the following fact sheets you were given: _____________________________________________________________________             [facility] - Preparing for Surgery Before surgery, you can play an important role.  Because skin is not sterile, your skin needs to be as free of germs as possible.  You can reduce the number of germs on your skin by washing with CHG (chlorahexidine gluconate) soap before surgery.  CHG is an antiseptic cleaner which kills germs and bonds with the skin to continue killing germs even after washing. Please DO NOT use if you have an allergy to CHG or antibacterial soaps.  If your skin becomes reddened/irritated stop using the CHG and inform your nurse when you arrive at Short Stay. Do not shave (including legs and underarms) for at least 48 hours prior to the first CHG shower.   You may shave your face/neck. Please follow these instructions carefully:  1.  Shower with CHG Soap the night before surgery and the  morning of Surgery.  2.  If you choose to wash your hair, wash your hair first as usual with your  normal  shampoo.  3.  After you shampoo, rinse your hair and body thoroughly to remove the  shampoo.                           4.  Use CHG as you would any other liquid soap.  You can apply chg directly  to the skin and wash                       Gently with a scrungie or  clean washcloth.  5.  Apply the CHG Soap to your body ONLY FROM THE NECK DOWN.   Do not use on face/ open                           Wound or open sores. Avoid contact with eyes, ears mouth and genitals (private parts).                       Wash face,  Genitals (private parts) with your normal soap.             6.  Wash thoroughly, paying special attention to the area where your surgery  will be performed.  7.  Thoroughly rinse your body with warm water from the neck down.  8.  DO NOT shower/wash with your normal soap after using and rinsing off  the CHG Soap.                9.  Pat yourself dry with a clean towel.            10.  Wear clean pajamas.            11.  Place clean sheets on your bed the night of your first shower and do not  sleep with pets. Day of Surgery : Do not apply any lotions/deodorants the morning of surgery.  Please wear clean clothes to the hospital/surgery center.  FAILURE TO FOLLOW THESE INSTRUCTIONS MAY RESULT IN THE CANCELLATION OF YOUR SURGERY PATIENT SIGNATURE_________________________________  NURSE SIGNATURE__________________________________  ________________________________________________________________________   Ian Robinson  An incentive spirometer is a tool that can help keep your lungs clear and active. This tool measures how well you are filling your lungs with each breath. Taking long deep breaths may help reverse or decrease the chance of  developing breathing (pulmonary) problems (especially infection) following:  A long period of time when you are unable to move or be active. BEFORE THE PROCEDURE   If the spirometer includes an indicator to show your best effort, your nurse or respiratory therapist will set it to a desired goal.  If possible, sit up straight or lean slightly forward. Try not to slouch.  Hold the incentive spirometer in an upright position. INSTRUCTIONS FOR USE  1. Sit on the edge of your bed if possible, or sit up as far as you can in bed or on a chair. 2. Hold the incentive spirometer in an upright position. 3. Breathe out normally. 4. Place the mouthpiece in your mouth and seal your lips tightly around it. 5. Breathe in slowly and as deeply as possible, raising the piston or the ball toward the top of the column. 6. Hold your breath for 3-5 seconds or for as long as possible. Allow the piston or ball to fall to the bottom of the column. 7. Remove the mouthpiece from your mouth and breathe out normally. 8. Rest for a few seconds and repeat Steps 1 through 7 at least 10 times every 1-2 hours when you are awake. Take your time and take a few normal breaths between deep breaths. 9. The spirometer may include an indicator to show your best effort. Use the indicator as a goal to work toward during each repetition. 10. After each set of 10 deep breaths, practice coughing to be sure your lungs are clear. If you have an incision (the cut made at the time of surgery), support your incision when coughing  by placing a pillow or rolled up towels firmly against it. Once you are able to get out of bed, walk around indoors and cough well. You may stop using the incentive spirometer when instructed by your caregiver.  RISKS AND COMPLICATIONS  Take your time so you do not get dizzy or light-headed.  If you are in pain, you may need to take or ask for pain medication before doing incentive spirometry. It is harder to take a  deep breath if you are having pain. AFTER USE  Rest and breathe slowly and easily.  It can be helpful to keep track of a log of your progress. Your caregiver can provide you with a simple table to help with this. If you are using the spirometer at home, follow these instructions: SEEK MEDICAL CARE IF:   You are having difficultly using the spirometer.  You have trouble using the spirometer as often as instructed.  Your pain medication is not giving enough relief while using the spirometer.  You develop fever of 100.5 F (38.1 C) or higher. SEEK IMMEDIATE MEDICAL CARE IF:   You cough up bloody sputum that had not been present before.  You develop fever of 102 F (38.9 C) or greater.  You develop worsening pain at or near the incision site. MAKE SURE YOU:   Understand these instructions.  Will watch your condition.  Will get help right away if you are not doing well or get worse. Document Released: 10/04/2006 Document Revised: 08/16/2011 Document Reviewed: 12/05/2006 ExitCare Patient Information 2014 ExitCare, [place].   ________________________________________________________________________  WHAT IS A BLOOD TRANSFUSION? Blood Transfusion Information  A transfusion is the replacement of blood or some of its parts. Blood is made up of multiple cells which provide different functions.  Red blood cells carry oxygen and are used for blood loss replacement.  White blood cells fight against infection.  Platelets control bleeding.  Plasma helps clot blood.  Other blood products are available for specialized needs, such as hemophilia or other clotting disorders. BEFORE THE TRANSFUSION  Who gives blood for transfusions?   Healthy volunteers who are fully evaluated to make sure their blood is safe. This is blood bank blood. Transfusion therapy is the safest it has ever been in the practice of medicine. Before blood is taken from a donor, a complete history is taken to make  sure that person has no history of diseases nor engages in risky social behavior (examples are intravenous drug use or sexual activity with multiple partners). The donor's travel history is screened to minimize risk of transmitting infections, such as malaria. The donated blood is tested for signs of infectious diseases, such as HIV and hepatitis. The blood is then tested to be sure it is compatible with you in order to minimize the chance of a transfusion reaction. If you or a relative donates blood, this is often done in anticipation of surgery and is not appropriate for emergency situations. It takes many days to process the donated blood. RISKS AND COMPLICATIONS Although transfusion therapy is very safe and saves many lives, the main dangers of transfusion include:   Getting an infectious disease.  Developing a transfusion reaction. This is an allergic reaction to something in the blood you were given. Every precaution is taken to prevent this. The decision to have a blood transfusion has been considered carefully by your caregiver before blood is given. Blood is not given unless the benefits outweigh the risks. AFTER THE TRANSFUSION  Right after receiving a  blood transfusion, you will usually feel much better and more energetic. This is especially true if your red blood cells have gotten low (anemic). The transfusion raises the level of the red blood cells which carry oxygen, and this usually causes an energy increase.  The nurse administering the transfusion will monitor you carefully for complications. HOME CARE INSTRUCTIONS  No special instructions are needed after a transfusion. You may find your energy is better. Speak with your caregiver about any limitations on activity for underlying diseases you may have. SEEK MEDICAL CARE IF:   Your condition is not improving after your transfusion.  You develop redness or irritation at the intravenous (IV) site. SEEK IMMEDIATE MEDICAL CARE IF:   Any of the following symptoms occur over the next 12 hours:  Shaking chills.  You have a temperature by mouth above 102 F (38.9 C), not controlled by medicine.  Chest, back, or muscle pain.  People around you feel you are not acting correctly or are confused.  Shortness of breath or difficulty breathing.  Dizziness and fainting.  You get a rash or develop hives.  You have a decrease in urine output.  Your urine turns a dark color or changes to pink, red, or brown. Any of the following symptoms occur over the next 10 days:  You have a temperature by mouth above 102 F (38.9 C), not controlled by medicine.  Shortness of breath.  Weakness after normal activity.  The white part of the eye turns yellow (jaundice).  You have a decrease in the amount of urine or are urinating less often.  Your urine turns a dark color or changes to pink, red, or brown. Document Released: 05/21/2000 Document Revised: 08/16/2011 Document Reviewed: 01/08/2008 [facility] Patient Information 2014 [place], [place].  _______________________________________________________________________

## 2018-03-01 ENCOUNTER — Encounter (HOSPITAL_COMMUNITY): Payer: Self-pay

## 2018-03-01 ENCOUNTER — Encounter (HOSPITAL_COMMUNITY)
Admission: RE | Admit: 2018-03-01 | Discharge: 2018-03-01 | Disposition: A | Discharge status: Home / Self Care | Payer: BLUE CROSS/BLUE SHIELD | Source: Ambulatory Visit | Attending: Orthopedic Surgery | Admitting: Orthopedic Surgery

## 2018-03-01 ENCOUNTER — Other Ambulatory Visit: Payer: Self-pay

## 2018-03-01 DIAGNOSIS — Z01812 Encounter for preprocedural laboratory examination: Secondary | ICD-10-CM | POA: Diagnosis not present

## 2018-03-01 HISTORY — DX: Unspecified asthma, uncomplicated: J45.909

## 2018-03-01 HISTORY — DX: Gastro-esophageal reflux disease without esophagitis: K21.9

## 2018-03-01 HISTORY — DX: Unspecified osteoarthritis, unspecified site: M19.90

## 2018-03-01 HISTORY — DX: Personal history of urinary calculi: Z87.442

## 2018-03-01 LAB — PROTIME-INR
INR: 0.93
Prothrombin Time: 12.4 s (ref 11.4–15.2)

## 2018-03-01 LAB — SURGICAL PCR SCREEN
MRSA, PCR: NEGATIVE
STAPHYLOCOCCUS AUREUS: NEGATIVE

## 2018-03-01 LAB — ABO/RH: ABO/RH(D): A POS

## 2018-03-01 LAB — APTT: aPTT: 31 seconds (ref 24–36)

## 2018-03-01 NOTE — Progress Notes (Signed)
   03/01/18 0854  OBSTRUCTIVE SLEEP APNEA  Have you ever been diagnosed with sleep apnea through a sleep study? No  Do you snore loudly (loud enough to be heard through closed doors)?  1  Do you often feel tired, fatigued, or sleepy during the daytime (such as falling asleep during driving or talking to someone)? 1  Has anyone observed you stop breathing during your sleep? 1  Do you have, or are you being treated for high blood pressure? 0  BMI more than 35 kg/m2? 0  Age > 50 (1-yes) 0  Neck circumference greater than:Male 16 inches or larger, Male 17inches or larger? 1  Male Gender (Yes=1) 1  Obstructive Sleep Apnea Score 5  Score 5 or greater  Results sent to PCP

## 2018-03-07 NOTE — Anesthesia Preprocedure Evaluation (Signed)
Anesthesia Evaluation  Patient identified by MRN, date of birth, ID band Patient awake    Reviewed: Allergy & Precautions, H&P , NPO status , Patient's Chart, lab work & pertinent test results, reviewed documented beta blocker date and time   Airway Mallampati: II  TM Distance: >3 FB Neck ROM: full    Dental no notable dental hx.    Pulmonary asthma , former smoker,    Pulmonary exam normal breath sounds clear to auscultation       Cardiovascular Exercise Tolerance: Good hypertension, negative cardio ROS   Rhythm:regular Rate:Normal     Neuro/Psych negative neurological ROS  negative psych ROS   GI/Hepatic Neg liver ROS, GERD  ,  Endo/Other  negative endocrine ROS  Renal/GU negative Renal ROS  negative genitourinary   Musculoskeletal  (+) Arthritis , Osteoarthritis,    Abdominal   Peds negative pediatric ROS (+)  Hematology negative hematology ROS (+)   Anesthesia Other Findings   Reproductive/Obstetrics negative OB ROS                             Anesthesia Physical Anesthesia Plan  ASA: II  Anesthesia Plan: General   Post-op Pain Management: GA combined w/ Regional for post-op pain   Induction:   PONV Risk Score and Plan: 2 and Treatment may vary due to age or medical condition and Ondansetron  Airway Management Planned: Nasal Cannula and Natural Airway  Additional Equipment:   Intra-op Plan:   Post-operative Plan:   Informed Consent: I have reviewed the patients History and Physical, chart, labs and discussed the procedure including the risks, benefits and alternatives for the proposed anesthesia with the patient or authorized representative who has indicated his/her understanding and acceptance.   Dental Advisory Given  Plan Discussed with: CRNA, Anesthesiologist and Surgeon  Anesthesia Plan Comments:         Anesthesia Quick Evaluation

## 2018-03-07 NOTE — H&P (Signed)
TOTAL KNEE ADMISSION H&P  Patient is being admitted for left total knee arthroplasty.  Subjective:  Chief Complaint:left knee pain.  HPI: Ian Robinson, 42 y.o. male, has a history of pain and functional disability in the left knee due to trauma and arthritis and has failed non-surgical conservative treatments for greater than 12 weeks to includeNSAID's and/or analgesics, corticosteriod injections, viscosupplementation injections, flexibility and strengthening excercises, supervised PT with diminished ADL's post treatment and activity modification.  Onset of symptoms was gradual, starting 4 years ago with gradually worsening course since that time. The patient noted prior procedures on the knee to include  arthroscopy and menisectomy on the left knee(s).  Patient currently rates pain in the left knee(s) at 7 out of 10 with activity. Patient has night pain, worsening of pain with activity and weight bearing, pain that interferes with activities of daily living, pain with passive range of motion, crepitus and joint swelling.  Patient has evidence of periarticular osteophytes and joint space narrowing by imaging studies. There is no active infection.  Patient Active Problem List   Diagnosis Date Noted  . Prediabetes 11/18/2015  . Essential hypertension, benign 11/18/2015  . 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 Vitamin D deficiency 04/25/2015   Past Medical History:  Diagnosis Date  . Arthritis   . Asthma   . GERD (gastroesophageal reflux disease)   . History of kidney stones   . Kidney stone   . Scoliosis     Past Surgical History:  Procedure Laterality Date  . APPENDECTOMY    . arm fracture surgery    . CARPAL TUNNEL RELEASE     left hand  . FRACTURE SURGERY     left wrist  . KNEE ARTHROSCOPY     right knee     Current Outpatient Medications  Medication Sig Dispense Refill Last Dose  . aspirin-acetaminophen-caffeine (EXCEDRIN MIGRAINE) 250-250-65 MG tablet Take 2 tablets by mouth 2  (two) times daily as needed for headache.     . cetirizine (ZYRTEC) 10 MG tablet Take 10 mg by mouth daily.   Taking  . montelukast (SINGULAIR) 10 MG tablet Take 10 mg by mouth daily as needed (allergies).      . Naphazoline-Pheniramine (OPCON-A) 0.027-0.315 % SOLN Place 2 drops into both eyes daily as needed (allergies).     Marland Kitchen omeprazole (PRILOSEC) 20 MG capsule Take 20 mg by mouth daily as needed (acid reflux).     Marland Kitchen testosterone cypionate (DEPOTESTOSTERONE CYPIONATE) 200 MG/ML injection Inject 1 mL (200 mg total) into the muscle every 14 (fourteen) days. 10 mL 0   . PROAIR HFA 108 (90 Base) MCG/ACT inhaler Inhale 2 Inhalers into the lungs every 6 (six) hours as needed for wheezing or shortness of breath.     . Syringe/Needle, Disp, (SYRINGE 3CC/20GX1") 20G X 1" 3 ML MISC Use to inject Testosterone 2 times a month 50 each 0    Allergies  Allergen Reactions  . Diclofenac     Upset stomach  . Percocet [Oxycodone-Acetaminophen]     migraines    Social History   Tobacco Use  . Smoking status: Former Smoker    Last attempt to quit: 06/08/2007    Years since quitting: 10.7  . Smokeless tobacco: Never Used  Substance Use Topics  . Alcohol use: Yes    Alcohol/week: 6.0 standard drinks    Types: 6 Cans of beer per week    Comment: occ 2 to 3 times a week    Family History  Problem Relation Age of Onset  . Congestive Heart Failure Mother   . Diabetes Mother   . Arthritis Father   . Diabetes Father   . Dementia Paternal Grandmother   . Diabetes Paternal Grandfather      Review of Systems  Constitutional: Negative.   HENT: Negative.   Eyes: Negative.   Respiratory: Positive for shortness of breath and wheezing. Negative for cough, hemoptysis and sputum production.        SOB and wheezing with exertion  Cardiovascular: Negative.   Gastrointestinal: Negative.   Genitourinary: Negative.   Musculoskeletal: Positive for falls, joint pain and myalgias. Negative for back pain and neck  pain.  Skin: Negative.   Neurological: Negative.   Endo/Heme/Allergies: Negative.   Psychiatric/Behavioral: Negative.     Objective:  Physical Exam  Constitutional: He is oriented to person, place, and time. He appears well-developed. No distress.  Obese  HENT:  Head: Normocephalic and atraumatic.  Right Ear: External ear normal.  Left Ear: External ear normal.  Nose: Nose normal.  Mouth/Throat: Oropharynx is clear and moist.  Eyes: Conjunctivae and EOM are normal.  Neck: Normal range of motion. Neck supple.  Cardiovascular: Normal rate, regular rhythm, normal heart sounds and intact distal pulses.  No murmur heard. Respiratory: Effort normal and breath sounds normal. No respiratory distress. He has no wheezes.  GI: Soft. Bowel sounds are normal. He exhibits no distension. There is no tenderness.  Musculoskeletal:  Normal painless motion of the hips and the right knee. He is tender medially about the left knee. No effusion. Moderate soft tissue swelling. Moderate patellofemoral crepitus. ROM 5-120 degrees. No instability.  Neurological: He is alert and oriented to person, place, and time. He has normal strength. No sensory deficit.  Skin: No rash noted. He is not diaphoretic. No erythema.  Psychiatric: He has a normal mood and affect. His behavior is normal.    Ht: 5 ft 10 in  Wt: 218 lbs  BMI: 31.3  BP: 124/90  Pulse: 76 bpm   Imaging Review Plain radiographs demonstrate severe degenerative joint disease of the left knee(s). The overall alignment ismild varus. The bone quality appears to be good for age and reported activity level.   Preoperative templating of the joint replacement has been completed, documented, and submitted to the Operating Room personnel in order to optimize intra-operative equipment management.   Anticipated LOS equal to or greater than 2 midnights due to - Age 42 and older with one or more of the following:  - Obesity  - Expected need for  hospital services (PT, OT, Nursing) required for safe  discharge  - Anticipated need for postoperative skilled nursing care or inpatient rehab  - Active co-morbidities: Diabetes and Asthma OR   - Unanticipated findings during/Post Surgery: None  - Patient is a high risk of re-admission due to: None     Assessment/Plan:  End stage post-traumatic osteoarthritis, left knee   The patient history, physical examination, clinical judgment of the provider and imaging studies are consistent with end stage degenerative joint disease of the left knee(s) and total knee arthroplasty is deemed medically necessary. The treatment options including medical management, injection therapy arthroscopy and arthroplasty were discussed at length. The risks and benefits of total knee arthroplasty were presented and reviewed. The risks due to aseptic loosening, infection, stiffness, patella tracking problems, thromboembolic complications and other imponderables were discussed. The patient acknowledged the explanation, agreed to proceed with the plan and consent was signed. Patient is being admitted  for inpatient treatment for surgery, pain control, PT, OT, prophylactic antibiotics, VTE prophylaxis, progressive ambulation and ADL's and discharge planning. The patient is planning to be discharged home.   Therapy Plans: outpatient therapy Disposition: Home with dad DME needed: walker PCP: Dr. Sherryll Burger (his NP Eustaquio Boyden) Other: no anesthesia concerns  Dimitri Ped, PA-C

## 2018-03-08 ENCOUNTER — Encounter (HOSPITAL_COMMUNITY)
Admission: RE | Disposition: A | Discharge status: Home / Self Care | Payer: Self-pay | Source: Ambulatory Visit | Attending: Orthopedic Surgery

## 2018-03-08 ENCOUNTER — Inpatient Hospital Stay (HOSPITAL_COMMUNITY): Payer: BLUE CROSS/BLUE SHIELD | Admitting: Anesthesiology

## 2018-03-08 ENCOUNTER — Other Ambulatory Visit: Payer: Self-pay

## 2018-03-08 ENCOUNTER — Inpatient Hospital Stay (HOSPITAL_COMMUNITY)
Admission: RE | Admit: 2018-03-08 | Discharge: 2018-03-10 | DRG: 470 | Disposition: A | Discharge status: Home / Self Care | Payer: BLUE CROSS/BLUE SHIELD | Source: Ambulatory Visit | Attending: Orthopedic Surgery | Admitting: Orthopedic Surgery

## 2018-03-08 ENCOUNTER — Encounter (HOSPITAL_COMMUNITY): Payer: Self-pay | Admitting: Emergency Medicine

## 2018-03-08 DIAGNOSIS — M1732 Unilateral post-traumatic osteoarthritis, left knee: Principal | ICD-10-CM | POA: Diagnosis present

## 2018-03-08 DIAGNOSIS — Z888 Allergy status to other drugs, medicaments and biological substances status: Secondary | ICD-10-CM | POA: Diagnosis not present

## 2018-03-08 DIAGNOSIS — Z87891 Personal history of nicotine dependence: Secondary | ICD-10-CM

## 2018-03-08 DIAGNOSIS — Z7951 Long term (current) use of inhaled steroids: Secondary | ICD-10-CM

## 2018-03-08 DIAGNOSIS — R7303 Prediabetes: Secondary | ICD-10-CM | POA: Diagnosis present

## 2018-03-08 DIAGNOSIS — K219 Gastro-esophageal reflux disease without esophagitis: Secondary | ICD-10-CM | POA: Diagnosis present

## 2018-03-08 DIAGNOSIS — J45909 Unspecified asthma, uncomplicated: Secondary | ICD-10-CM | POA: Diagnosis present

## 2018-03-08 DIAGNOSIS — Z79899 Other long term (current) drug therapy: Secondary | ICD-10-CM | POA: Diagnosis not present

## 2018-03-08 DIAGNOSIS — Z885 Allergy status to narcotic agent status: Secondary | ICD-10-CM

## 2018-03-08 DIAGNOSIS — Z96652 Presence of left artificial knee joint: Secondary | ICD-10-CM

## 2018-03-08 DIAGNOSIS — I10 Essential (primary) hypertension: Secondary | ICD-10-CM | POA: Diagnosis present

## 2018-03-08 HISTORY — PX: TOTAL KNEE ARTHROPLASTY: SHX125

## 2018-03-08 LAB — TYPE AND SCREEN
ABO/RH(D): A POS
Antibody Screen: NEGATIVE

## 2018-03-08 LAB — GLUCOSE, CAPILLARY: GLUCOSE-CAPILLARY: 95 mg/dL (ref 70–99)

## 2018-03-08 SURGERY — ARTHROPLASTY, KNEE, TOTAL
Anesthesia: General | Site: Knee | Laterality: Left

## 2018-03-08 MED ORDER — TRANEXAMIC ACID 1000 MG/10ML IV SOLN
1000.0000 mg | INTRAVENOUS | Status: AC
  Administered 2018-03-08: 1000 mg via INTRAVENOUS
  Filled 2018-03-08: qty 10

## 2018-03-08 MED ORDER — NAPHAZOLINE-PHENIRAMINE 0.025-0.3 % OP SOLN
2.0000 [drp] | Freq: Every day | OPHTHALMIC | Status: DC | PRN
  Filled 2018-03-08: qty 15

## 2018-03-08 MED ORDER — PHENYLEPHRINE 40 MCG/ML (10ML) SYRINGE FOR IV PUSH (FOR BLOOD PRESSURE SUPPORT)
PREFILLED_SYRINGE | INTRAVENOUS | Status: AC
  Filled 2018-03-08: qty 10

## 2018-03-08 MED ORDER — MENTHOL 3 MG MT LOZG: 1.0000 | LOZENGE | OROMUCOSAL | Status: DC | PRN

## 2018-03-08 MED ORDER — NAPHAZOLINE-PHENIRAMINE 0.027-0.315 % OP SOLN: 2.0000 [drp] | Freq: Every day | OPHTHALMIC | Status: DC | PRN

## 2018-03-08 MED ORDER — SODIUM CHLORIDE 0.9 % IV SOLN
INTRAVENOUS | Status: AC
  Filled 2018-03-08: qty 500000

## 2018-03-08 MED ORDER — PHENYLEPHRINE 40 MCG/ML (10ML) SYRINGE FOR IV PUSH (FOR BLOOD PRESSURE SUPPORT)
PREFILLED_SYRINGE | INTRAVENOUS | Status: DC | PRN
  Administered 2018-03-08: 80 ug via INTRAVENOUS

## 2018-03-08 MED ORDER — CHLORHEXIDINE GLUCONATE 4 % EX LIQD: 60.0000 mL | Freq: Once | CUTANEOUS | Status: DC

## 2018-03-08 MED ORDER — PANTOPRAZOLE SODIUM 40 MG PO TBEC
40.0000 mg | DELAYED_RELEASE_TABLET | Freq: Every day | ORAL | Status: DC
  Administered 2018-03-08 – 2018-03-10 (×3): 40 mg via ORAL
  Filled 2018-03-08 (×3): qty 1

## 2018-03-08 MED ORDER — DEXAMETHASONE SODIUM PHOSPHATE 10 MG/ML IJ SOLN
INTRAMUSCULAR | Status: DC | PRN
  Administered 2018-03-08: 10 mg via INTRAVENOUS

## 2018-03-08 MED ORDER — MONTELUKAST SODIUM 10 MG PO TABS: 10.0000 mg | ORAL_TABLET | Freq: Every day | ORAL | Status: DC | PRN

## 2018-03-08 MED ORDER — OXYCODONE HCL 5 MG/5ML PO SOLN
5.0000 mg | Freq: Once | ORAL | Status: DC | PRN
  Filled 2018-03-08: qty 5

## 2018-03-08 MED ORDER — DOCUSATE SODIUM 100 MG PO CAPS
100.0000 mg | ORAL_CAPSULE | Freq: Two times a day (BID) | ORAL | Status: DC
  Administered 2018-03-08 – 2018-03-10 (×4): 100 mg via ORAL
  Filled 2018-03-08 (×4): qty 1

## 2018-03-08 MED ORDER — LACTATED RINGERS IV SOLN
INTRAVENOUS | Status: DC
  Administered 2018-03-08 – 2018-03-09 (×2): via INTRAVENOUS

## 2018-03-08 MED ORDER — ACETAMINOPHEN 160 MG/5ML PO SOLN: 325.0000 mg | ORAL | Status: DC | PRN

## 2018-03-08 MED ORDER — ALUM & MAG HYDROXIDE-SIMETH 200-200-20 MG/5ML PO SUSP
30.0000 mL | ORAL | Status: DC | PRN
  Administered 2018-03-09 (×2): 30 mL via ORAL
  Filled 2018-03-08 (×2): qty 30

## 2018-03-08 MED ORDER — PROPOFOL 500 MG/50ML IV EMUL
INTRAVENOUS | Status: DC | PRN
  Administered 2018-03-08: 100 ug/kg/min via INTRAVENOUS

## 2018-03-08 MED ORDER — METOCLOPRAMIDE HCL 5 MG/ML IJ SOLN: 5.0000 mg | Freq: Three times a day (TID) | INTRAMUSCULAR | Status: DC | PRN

## 2018-03-08 MED ORDER — ACETAMINOPHEN 325 MG PO TABS: 325.0000 mg | ORAL_TABLET | ORAL | Status: DC | PRN

## 2018-03-08 MED ORDER — ONDANSETRON HCL 4 MG PO TABS
4.0000 mg | ORAL_TABLET | Freq: Four times a day (QID) | ORAL | Status: DC | PRN
  Administered 2018-03-10: 4 mg via ORAL
  Filled 2018-03-08: qty 1

## 2018-03-08 MED ORDER — PROPOFOL 10 MG/ML IV BOLUS
INTRAVENOUS | Status: AC
  Filled 2018-03-08: qty 20

## 2018-03-08 MED ORDER — LACTATED RINGERS IV SOLN
INTRAVENOUS | Status: DC
  Administered 2018-03-08 (×2): via INTRAVENOUS

## 2018-03-08 MED ORDER — BISACODYL 5 MG PO TBEC: 5.0000 mg | DELAYED_RELEASE_TABLET | Freq: Every day | ORAL | Status: DC | PRN

## 2018-03-08 MED ORDER — SODIUM CHLORIDE 0.9 % IV SOLN
INTRAVENOUS | Status: DC | PRN
  Administered 2018-03-08: 500 mL

## 2018-03-08 MED ORDER — HYDROMORPHONE HCL 2 MG PO TABS: 2.0000 mg | ORAL_TABLET | ORAL | Status: DC | PRN

## 2018-03-08 MED ORDER — ALBUTEROL SULFATE HFA 108 (90 BASE) MCG/ACT IN AERS: 1.0000 | INHALATION_SPRAY | Freq: Four times a day (QID) | RESPIRATORY_TRACT | Status: DC | PRN

## 2018-03-08 MED ORDER — SODIUM CHLORIDE 0.9 % IJ SOLN
INTRAMUSCULAR | Status: AC
  Filled 2018-03-08: qty 20

## 2018-03-08 MED ORDER — SODIUM CHLORIDE 0.9 % IJ SOLN
INTRAMUSCULAR | Status: DC | PRN
  Administered 2018-03-08: 20 mL

## 2018-03-08 MED ORDER — ROPIVACAINE HCL 7.5 MG/ML IJ SOLN
INTRAMUSCULAR | Status: DC | PRN
  Administered 2018-03-08: 25 mL via PERINEURAL

## 2018-03-08 MED ORDER — METOCLOPRAMIDE HCL 5 MG PO TABS: 5.0000 mg | ORAL_TABLET | Freq: Three times a day (TID) | ORAL | Status: DC | PRN

## 2018-03-08 MED ORDER — ONDANSETRON HCL 4 MG/2ML IJ SOLN: 4.0000 mg | Freq: Once | INTRAMUSCULAR | Status: DC | PRN

## 2018-03-08 MED ORDER — MIDAZOLAM HCL 2 MG/2ML IJ SOLN
INTRAMUSCULAR | Status: AC
  Filled 2018-03-08: qty 2

## 2018-03-08 MED ORDER — ONDANSETRON HCL 4 MG/2ML IJ SOLN: 4.0000 mg | Freq: Four times a day (QID) | INTRAMUSCULAR | Status: DC | PRN

## 2018-03-08 MED ORDER — BUPIVACAINE IN DEXTROSE 0.75-8.25 % IT SOLN
INTRATHECAL | Status: DC | PRN
  Administered 2018-03-08: 15 mg via INTRATHECAL

## 2018-03-08 MED ORDER — POLYETHYLENE GLYCOL 3350 17 G PO PACK: 17.0000 g | PACK | Freq: Every day | ORAL | Status: DC | PRN

## 2018-03-08 MED ORDER — ACETAMINOPHEN 500 MG PO TABS
1000.0000 mg | ORAL_TABLET | Freq: Four times a day (QID) | ORAL | Status: AC
  Administered 2018-03-08 – 2018-03-09 (×4): 1000 mg via ORAL
  Filled 2018-03-08 (×4): qty 2

## 2018-03-08 MED ORDER — ONDANSETRON HCL 4 MG/2ML IJ SOLN
INTRAMUSCULAR | Status: DC | PRN
  Administered 2018-03-08: 4 mg via INTRAVENOUS

## 2018-03-08 MED ORDER — BUPIVACAINE LIPOSOME 1.3 % IJ SUSP
INTRAMUSCULAR | Status: DC | PRN
  Administered 2018-03-08: 20 mL

## 2018-03-08 MED ORDER — FERROUS SULFATE 325 (65 FE) MG PO TABS
325.0000 mg | ORAL_TABLET | Freq: Three times a day (TID) | ORAL | Status: DC
  Administered 2018-03-08 – 2018-03-10 (×4): 325 mg via ORAL
  Filled 2018-03-08 (×4): qty 1

## 2018-03-08 MED ORDER — FENTANYL CITRATE (PF) 100 MCG/2ML IJ SOLN: 25.0000 ug | INTRAMUSCULAR | Status: DC | PRN

## 2018-03-08 MED ORDER — HYDROMORPHONE HCL 2 MG PO TABS
2.0000 mg | ORAL_TABLET | ORAL | Status: DC | PRN
  Administered 2018-03-08 – 2018-03-09 (×2): 2 mg via ORAL
  Filled 2018-03-08 (×2): qty 1

## 2018-03-08 MED ORDER — FENTANYL CITRATE (PF) 100 MCG/2ML IJ SOLN
50.0000 ug | INTRAMUSCULAR | Status: DC
  Filled 2018-03-08: qty 2

## 2018-03-08 MED ORDER — PROPOFOL 10 MG/ML IV BOLUS
INTRAVENOUS | Status: AC
  Filled 2018-03-08: qty 40

## 2018-03-08 MED ORDER — HYDROMORPHONE HCL 1 MG/ML IJ SOLN
0.5000 mg | INTRAMUSCULAR | Status: DC | PRN
  Administered 2018-03-08 – 2018-03-09 (×4): 1 mg via INTRAVENOUS
  Filled 2018-03-08 (×4): qty 1

## 2018-03-08 MED ORDER — THROMBIN (RECOMBINANT) 5000 UNITS EX SOLR
CUTANEOUS | Status: AC
  Filled 2018-03-08: qty 5000

## 2018-03-08 MED ORDER — THROMBIN 5000 UNITS EX SOLR
CUTANEOUS | Status: DC | PRN
  Administered 2018-03-08: 5000 [IU] via TOPICAL

## 2018-03-08 MED ORDER — ACETAMINOPHEN 325 MG PO TABS: 325.0000 mg | ORAL_TABLET | Freq: Four times a day (QID) | ORAL | Status: DC | PRN

## 2018-03-08 MED ORDER — ONDANSETRON HCL 4 MG/2ML IJ SOLN
INTRAMUSCULAR | Status: AC
  Filled 2018-03-08: qty 2

## 2018-03-08 MED ORDER — MIDAZOLAM HCL 2 MG/2ML IJ SOLN
1.0000 mg | INTRAMUSCULAR | Status: DC
  Filled 2018-03-08: qty 2

## 2018-03-08 MED ORDER — HYDROMORPHONE HCL 2 MG PO TABS
2.0000 mg | ORAL_TABLET | ORAL | Status: DC | PRN
  Administered 2018-03-08: 2 mg via ORAL
  Filled 2018-03-08: qty 1

## 2018-03-08 MED ORDER — MIDAZOLAM HCL 5 MG/5ML IJ SOLN
INTRAMUSCULAR | Status: DC | PRN
  Administered 2018-03-08: 2 mg via INTRAVENOUS

## 2018-03-08 MED ORDER — DEXAMETHASONE SODIUM PHOSPHATE 10 MG/ML IJ SOLN
INTRAMUSCULAR | Status: AC
  Filled 2018-03-08: qty 1

## 2018-03-08 MED ORDER — FENTANYL CITRATE (PF) 100 MCG/2ML IJ SOLN
INTRAMUSCULAR | Status: DC | PRN
  Administered 2018-03-08: 100 ug via INTRAVENOUS

## 2018-03-08 MED ORDER — OXYCODONE HCL 5 MG PO TABS: 5.0000 mg | ORAL_TABLET | Freq: Once | ORAL | Status: DC | PRN

## 2018-03-08 MED ORDER — MEPERIDINE HCL 50 MG/ML IJ SOLN: 6.2500 mg | INTRAMUSCULAR | Status: DC | PRN

## 2018-03-08 MED ORDER — FENTANYL CITRATE (PF) 100 MCG/2ML IJ SOLN
INTRAMUSCULAR | Status: AC
  Filled 2018-03-08: qty 2

## 2018-03-08 MED ORDER — PHENOL 1.4 % MT LIQD
1.0000 | OROMUCOSAL | Status: DC | PRN
  Filled 2018-03-08: qty 177

## 2018-03-08 MED ORDER — CEFAZOLIN SODIUM-DEXTROSE 2-4 GM/100ML-% IV SOLN
2.0000 g | INTRAVENOUS | Status: AC
  Administered 2018-03-08: 2 g via INTRAVENOUS
  Filled 2018-03-08: qty 100

## 2018-03-08 MED ORDER — CEFAZOLIN SODIUM-DEXTROSE 1-4 GM/50ML-% IV SOLN
1.0000 g | Freq: Four times a day (QID) | INTRAVENOUS | Status: AC
  Administered 2018-03-08 (×2): 1 g via INTRAVENOUS
  Filled 2018-03-08 (×2): qty 50

## 2018-03-08 MED ORDER — FLEET ENEMA 7-19 GM/118ML RE ENEM: 1.0000 | ENEMA | Freq: Once | RECTAL | Status: DC | PRN

## 2018-03-08 MED ORDER — STERILE WATER FOR IRRIGATION IR SOLN
Status: DC | PRN
  Administered 2018-03-08: 2000 mL

## 2018-03-08 MED ORDER — ALBUTEROL SULFATE (2.5 MG/3ML) 0.083% IN NEBU: 2.5000 mg | INHALATION_SOLUTION | Freq: Four times a day (QID) | RESPIRATORY_TRACT | Status: DC | PRN

## 2018-03-08 MED ORDER — RIVAROXABAN 10 MG PO TABS
10.0000 mg | ORAL_TABLET | Freq: Every day | ORAL | Status: DC
  Administered 2018-03-09 – 2018-03-10 (×2): 10 mg via ORAL
  Filled 2018-03-08 (×2): qty 1

## 2018-03-08 SURGICAL SUPPLY — 73 items
BAG DECANTER FOR FLEXI CONT (MISCELLANEOUS) ×3 IMPLANT
BAG ZIPLOCK 12X15 (MISCELLANEOUS) IMPLANT
BANDAGE ACE 4X5 VEL STRL LF (GAUZE/BANDAGES/DRESSINGS) ×3 IMPLANT
BANDAGE ACE 6X5 VEL STRL LF (GAUZE/BANDAGES/DRESSINGS) ×3 IMPLANT
BANDAGE ELASTIC 6 VELCRO ST LF (GAUZE/BANDAGES/DRESSINGS) ×3 IMPLANT
BLADE SAG 18X100X1.27 (BLADE) ×3 IMPLANT
BLADE SAW SGTL 11.0X1.19X90.0M (BLADE) ×3 IMPLANT
BNDG GAUZE ELAST 4 BULKY (GAUZE/BANDAGES/DRESSINGS) ×6 IMPLANT
CEMENT HV SMART SET (Cement) ×6 IMPLANT
CEMENT TIBIA MBT SIZE 4 (Knees) ×1 IMPLANT
CLOSURE WOUND 1/2 X4 (GAUZE/BANDAGES/DRESSINGS) ×2
COVER SURGICAL LIGHT HANDLE (MISCELLANEOUS) ×3 IMPLANT
CUFF TOURN SGL QUICK 34 (TOURNIQUET CUFF) ×2
CUFF TRNQT CYL 34X4X40X1 (TOURNIQUET CUFF) ×1 IMPLANT
DECANTER SPIKE VIAL GLASS SM (MISCELLANEOUS) ×3 IMPLANT
DRAPE INCISE IOBAN 66X45 STRL (DRAPES) IMPLANT
DRAPE U-SHAPE 47X51 STRL (DRAPES) ×3 IMPLANT
DRSG ADAPTIC 3X8 NADH LF (GAUZE/BANDAGES/DRESSINGS) ×3 IMPLANT
DRSG EMULSION OIL 3X16 NADH (GAUZE/BANDAGES/DRESSINGS) ×3 IMPLANT
DRSG PAD ABDOMINAL 8X10 ST (GAUZE/BANDAGES/DRESSINGS) ×6 IMPLANT
DURAPREP 26ML APPLICATOR (WOUND CARE) ×3 IMPLANT
ELECT BLADE TIP CTD 4 INCH (ELECTRODE) ×3 IMPLANT
ELECT REM PT RETURN 15FT ADLT (MISCELLANEOUS) ×3 IMPLANT
EVACUATOR 1/8 PVC DRAIN (DRAIN) ×3 IMPLANT
FACESHIELD WRAPAROUND (MASK) ×3 IMPLANT
FEMUR SIGMA PS SZ 4.0 L (Knees) ×3 IMPLANT
GAUZE SPONGE 4X4 12PLY STRL (GAUZE/BANDAGES/DRESSINGS) ×3 IMPLANT
GLOVE BIOGEL PI IND STRL 6.5 (GLOVE) ×1 IMPLANT
GLOVE BIOGEL PI IND STRL 8.5 (GLOVE) ×1 IMPLANT
GLOVE BIOGEL PI INDICATOR 6.5 (GLOVE) ×2
GLOVE BIOGEL PI INDICATOR 8.5 (GLOVE) ×2
GLOVE ECLIPSE 8.0 STRL XLNG CF (GLOVE) ×6 IMPLANT
GLOVE SURG SS PI 6.5 STRL IVOR (GLOVE) ×3 IMPLANT
GOWN STRL REUS W/TWL LRG LVL3 (GOWN DISPOSABLE) ×3 IMPLANT
GOWN STRL REUS W/TWL XL LVL3 (GOWN DISPOSABLE) ×3 IMPLANT
HANDPIECE INTERPULSE COAX TIP (DISPOSABLE) ×2
HEMOSTAT SPONGE AVITENE ULTRA (HEMOSTASIS) ×3 IMPLANT
HOLDER FOLEY CATH W/STRAP (MISCELLANEOUS) IMPLANT
IMMOBILIZER KNEE 20 (SOFTGOODS) ×3
IMMOBILIZER KNEE 20 THIGH 36 (SOFTGOODS) ×1 IMPLANT
IMMOBILIZER KNEE 22 UNIV (SOFTGOODS) ×3 IMPLANT
MANIFOLD NEPTUNE II (INSTRUMENTS) ×3 IMPLANT
NS IRRIG 1000ML POUR BTL (IV SOLUTION) IMPLANT
PACK TOTAL KNEE CUSTOM (KITS) ×3 IMPLANT
PAD ABD 7.5X8 STRL (GAUZE/BANDAGES/DRESSINGS) ×3 IMPLANT
PAD CAST 4YDX4 CTTN HI CHSV (CAST SUPPLIES) ×1 IMPLANT
PADDING CAST COTTON 4X4 STRL (CAST SUPPLIES) ×2
PADDING CAST COTTON 6X4 STRL (CAST SUPPLIES) ×3 IMPLANT
PATELLA DOME PFC 41MM (Knees) ×3 IMPLANT
PENCIL SMOKE EVAC W/HOLSTER (ELECTROSURGICAL) ×3 IMPLANT
PIN STEINMAN FIXATION KNEE (PIN) ×3 IMPLANT
PLATE ROT INSERT 12.5MM SIZE 4 (Plate) ×3 IMPLANT
POSITIONER SURGICAL ARM (MISCELLANEOUS) ×3 IMPLANT
SET HNDPC FAN SPRY TIP SCT (DISPOSABLE) ×1 IMPLANT
SET PAD KNEE POSITIONER (MISCELLANEOUS) ×3 IMPLANT
SPONGE LAP 18X18 RF (DISPOSABLE) IMPLANT
STRIP CLOSURE SKIN 1/2X4 (GAUZE/BANDAGES/DRESSINGS) ×4 IMPLANT
SUT BONE WAX W31G (SUTURE) ×3 IMPLANT
SUT MNCRL AB 4-0 PS2 18 (SUTURE) ×3 IMPLANT
SUT STRATAFIX 0 PDS 27 VIOLET (SUTURE) ×3
SUT VIC AB 1 CT1 27 (SUTURE) ×2
SUT VIC AB 1 CT1 27XBRD ANTBC (SUTURE) ×1 IMPLANT
SUT VIC AB 2-0 CT1 27 (SUTURE) ×6
SUT VIC AB 2-0 CT1 TAPERPNT 27 (SUTURE) ×3 IMPLANT
SUTURE STRATFX 0 PDS 27 VIOLET (SUTURE) ×1 IMPLANT
SYR 20CC LL (SYRINGE) ×6 IMPLANT
TAPE STRIPS DRAPE STRL (GAUZE/BANDAGES/DRESSINGS) ×3 IMPLANT
TIBIA MBT CEMENT SIZE 4 (Knees) ×3 IMPLANT
TOWER CARTRIDGE SMART MIX (DISPOSABLE) ×3 IMPLANT
TRAY FOLEY MTR SLVR 16FR STAT (SET/KITS/TRAYS/PACK) ×3 IMPLANT
WATER STERILE IRR 1000ML POUR (IV SOLUTION) ×3 IMPLANT
WRAP KNEE MAXI GEL POST OP (GAUZE/BANDAGES/DRESSINGS) ×3 IMPLANT
YANKAUER SUCT BULB TIP 10FT TU (MISCELLANEOUS) ×3 IMPLANT

## 2018-03-08 NOTE — Op Note (Signed)
NAME: Ian Robinson, Ian Robinson MEDICAL RECORD ZO:[id number] ACCOUNT [id number] DATE OF BIRTH:04/06/1976 FACILITY: [facility] LOCATION: [facility] PHYSICIAN:Delmar Arriaga Sanjuana Kava, MD  OPERATIVE REPORT  DATE OF PROCEDURE:  03/08/2018  SURGEON:  Ranee Gosselin, MD  ASSISTANT:  Dimitri Ped, PA.  PREOPERATIVE DIAGNOSES:   1.  Flexion contracture, left knee. 2.  Primary osteoarthritis with bone-on-bone, left knee.  POSTOPERATIVE DIAGNOSES:   1.  Flexion contracture, left knee. 2.  Primary osteoarthritis with bone-on-bone, left knee.  PROCEDURE:   1.  Left total knee arthroplasty utilizing the DePuy system.  Sizes used was a size 4 left femoral component, posterior cruciate sacrificing type tray was a size 4 insert was a size 4, 12.5 mm thickness.  The patella was a size 41 with 3 pegs. 2.  Release of contractures.  DESCRIPTION OF PROCEDURE:  We did do the appropriate time-out and we also marked the appropriate left leg in the holding area.  Under spinal anesthesia, routine orthopedic prep and draping the left lower extremity was carried out.  He had an adductor  block in the holding area.  At this time, the appropriate timeout was carried out after we did the prep.  The left leg was first exsanguinated with an Esmarch and tourniquet was elevated to 350 mmHg.  Left leg was placed in the [facility] knee holder.  At  this time with the knee flexed an incision was made over the anterior aspect of the left knee.  Bleeders identified and cauterized.  Two flaps were created.  I then carried out a median parapatellar approach.  The patella was reflected laterally.   Following that with the knee flexed, I did medial and lateral meniscectomies and excised the anterior and posterior cruciate ligaments.  At this particular time, we then made our initial drill hole in the intercondylar notch.  A guide rod was inserted  nicely up the canal.  At this particular time, we then removed 11 mm thickness off the distal femur  utilizing the appropriate jig.  We then measured the distal femur to be a size 4.  We did anterior, posterior chamfering cuts for a size 4 left femoral  component.  At this time, attention was directed to the tibia.  We removed the spinous processes of the tibia with the oscillating saw.  We then utilized our external guide and removed the appropriate amount of bone utilizing 4 mm off of the medial side  as our guide.  Once the plateau was prepared, we then inserted our lamina spreaders made sure all the posterior spurs were removed.  We completed our meniscectomies.  Following that, we then cut our keel cut out the tibia plateau in usual fashion.  We  did use as I mentioned, the external guide initially.  At this time, we then cut our notch cut of the distal femur.  The femur was measured.  I measured it to be a size 4.  At this time, after the appropriate cuts were made, the trial components were  inserted.  We then kept the knee in extension and did a resurfacing procedure on the patella for a size 41 patella.  Three drill holes were made in the articular surface of the patella.  The patellar button was first tried to make sure we had a good fit  and we did.  We removed all trial components, thoroughly water picked out the knee.  We then cemented all 3 components in simultaneously.  After the cement was hardened, we removed all loose  pieces of cement.  We then selected a 12 mm thickness, size 4  rotating platform insert.  We reduced the knee and had excellent function.  At this time, the Hemovac drain was inserted and the knee was closed in the usual fashion over a Hemovac drain.  At the end of the procedure before closing the skin, we injected  the soft tissue with 20 mL of Exparel and 20 mL of normal saline mixture.  The Hemovac drain was inserted as I mentioned prior to closure.  Sterile dressings were applied.  The patient had 2 grams of IV Ancef preop.  He also had a gram of TXA.  Surgeon  once again  Dr. Darrelyn Hillock and assistant Dimitri Ped PA.  The patient will be admitted.  He had an adductor block prior to surgery in the holding area.  TN/NUANCE  D:03/08/2018 T:03/08/2018 JOB:[id number]

## 2018-03-08 NOTE — Anesthesia Procedure Notes (Signed)
Spinal  Patient location during procedure: OR Start time: 03/08/2018 8:34 AM End time: 03/08/2018 8:36 AM Staffing Resident/CRNA: Lind Covert, CRNA Performed: resident/CRNA  Preanesthetic Checklist Completed: patient identified, site marked, surgical consent, pre-op evaluation, timeout performed, IV checked, risks and benefits discussed and monitors and equipment checked Spinal Block Patient position: sitting Prep: DuraPrep Patient monitoring: heart rate, cardiac monitor, continuous pulse ox and blood pressure Approach: midline Location: L3-4 Injection technique: single-shot Needle Needle type: Pencan  Needle gauge: 24 G Needle length: 10 cm Needle insertion depth: 7 cm Assessment Sensory level: T6 Additional Notes Timeout performed. SAB kit date checked. SAB without difficulty.

## 2018-03-08 NOTE — Evaluation (Signed)
Physical Therapy Evaluation Patient Details Name: Ian Robinson MRN: [id number] DOB: [birth year] Today's Date: 03/08/2018   History of Present Illness  42 YO male s/p L TKR on 03/08/18. PMH includes pre DM, HTN, arthritis, kidney stones, scoliosis, carpal tunnel release, R knee scope.   Clinical Impression   Pt presents with L knee pain especially with mobility, decreased safety awareness and impulsivity, difficulty performing mobility tasks, and decreased tolerance for ambulation due to pain. Pt to benefit from acute PT to address deficits. Pt ambulated short distance with RW with min guard assist. PT to progress mobility as tolerated and will continue to follow acutely.      Follow Up Recommendations Follow surgeon's recommendation for DC plan and follow-up therapies;Supervision for mobility/OOB(OPPT)    Equipment Recommendations  None recommended by PT    Recommendations for Other Services       Precautions / Restrictions Precautions Precautions: Fall Restrictions Weight Bearing Restrictions: No Other Position/Activity Restrictions: WBAT       Mobility  Bed Mobility Overal bed mobility: Needs Assistance Bed Mobility: Supine to Sit     Supine to sit: Min guard;HOB elevated     General bed mobility comments: Min guard for safety. Pt attempting to mobilize before PT was ready.   Transfers Overall transfer level: Needs assistance Equipment used: Rolling walker (2 wheeled) Transfers: Sit to/from Stand Sit to Stand: Min guard;From elevated surface         General transfer comment: Min guard for safety, verbal cuing for hand placement on RW. sit to stand x2, once from bed and once from toilet.   Ambulation/Gait Ambulation/Gait assistance: Min guard Gait Distance (Feet): 30 Feet Assistive device: Rolling walker (2 wheeled) Gait Pattern/deviations: Step-to pattern;Decreased stride length;Decreased weight shift to left;Decreased stance time - left;Antalgic Gait velocity:  decr    General Gait Details: Min guard for safety. Verbal cuing for sequencing, placement in RW. After 10 ft ambulation, pt reporting needing to have bowel movement. Pt ambulated to bathroom with min guard, assisted to sit. Approximately 5 minutes later, pt attempting to mobilize independently and refusing to wear gait belt because he stated "I don't need it". Pt with standing balance at sink for approximately 5 minutes with washing hands, brushing teeth. Pt then ambulated back to chair.   Stairs            Wheelchair Mobility    Modified Rankin (Stroke Patients Only)       Balance Overall balance assessment: Mild deficits observed, not formally tested                                           Pertinent Vitals/Pain Pain Assessment: 0-10 Pain Score: 3  Pain Location: L knee  Pain Descriptors / Indicators: Throbbing;Aching Pain Intervention(s): Limited activity within patient's tolerance;Monitored during session;Ice applied;Repositioned    Home Living Family/patient expects to be discharged to:: Private residence Living Arrangements: Spouse/significant other Available Help at Discharge: Family;Available PRN/intermittently(father will be available prn) Type of Home: Mobile home Home Access: Stairs to enter Entrance Stairs-Rails: Right;Left;Can reach both Entrance Stairs-Number of Steps: 3 Home Layout: One level Home Equipment: Hand held shower head;Crutches;Walker - 2 wheels      Prior Function Level of Independence: Independent               Hand Dominance   Dominant Hand: Left    Extremity/Trunk Assessment  Upper Extremity Assessment Upper Extremity Assessment: Overall WFL for tasks assessed    Lower Extremity Assessment Lower Extremity Assessment: Overall WFL for tasks assessed(LLE: able to perform quad set, SLR with assist, ankle pumps )    Cervical / Trunk Assessment Cervical / Trunk Assessment: Normal  Communication    Communication: No difficulties  Cognition Arousal/Alertness: Awake/alert Behavior During Therapy: Impulsive Overall Cognitive Status: Within Functional Limits for tasks assessed                                 General Comments: Pt mobilizing without warning, resistant to use of gait belt or standby assist from PT.       General Comments      Exercises Total Joint Exercises Ankle Circles/Pumps: AROM;10 reps;Both;Seated Quad Sets: AROM;Left;5 reps;Seated Heel Slides: AAROM;Left;Supine(2 reps, limited by pain ) Goniometric ROM: knee aarom approximately 5-60*, limited by pain    Assessment/Plan    PT Assessment Patient needs continued PT services  PT Problem List Decreased strength;Pain;Decreased range of motion;Decreased activity tolerance;Decreased knowledge of use of DME;Decreased balance;Decreased safety awareness;Decreased mobility       PT Treatment Interventions DME instruction;Therapeutic activities;Gait training;Therapeutic exercise;Patient/family education;Stair training;Balance training;Functional mobility training    PT Goals (Current goals can be found in the Care Plan section)  Acute Rehab PT Goals Patient Stated Goal: None stated  PT Goal Formulation: With patient Time For Goal Achievement: 03/22/18 Potential to Achieve Goals: Good    Frequency 7X/week   Barriers to discharge        Co-evaluation               AM-PAC PT "6 Clicks" Daily Activity  Outcome Measure Difficulty turning over in bed (including adjusting bedclothes, sheets and blankets)?: Unable Difficulty moving from lying on back to sitting on the side of the bed? : Unable Difficulty sitting down on and standing up from a chair with arms (e.g., wheelchair, bedside commode, etc,.)?: Unable Help needed moving to and from a bed to chair (including a wheelchair)?: A Little Help needed walking in hospital room?: A Little Help needed climbing 3-5 steps with a railing? : A Lot 6  Click Score: 11    End of Session Equipment Utilized During Treatment: Gait belt Activity Tolerance: Patient tolerated treatment well;Patient limited by pain Patient left: in chair;with chair alarm set;with call bell/phone within reach;with family/visitor present;with SCD's reapplied Nurse Communication: Mobility status PT Visit Diagnosis: Other abnormalities of gait and mobility (R26.89);Difficulty in walking, not elsewhere classified (R26.2)    Time: 1710-1800 PT Time Calculation (min) (ACUTE ONLY): 50 min   Charges:   PT Evaluation $PT Eval Low Complexity: 1 Low PT Treatments $Gait Training: 8-22 mins $Therapeutic Activity: 8-22 mins       Nicola Police, PT Acute Rehabilitation Services Pager [phone]  Office [phone]  Nyashia Raney D Despina Hidden 03/08/2018, 7:26 PM

## 2018-03-08 NOTE — Brief Op Note (Signed)
03/08/2018  10:30 AM  PATIENT:  HARDIN HARDENBROOK  42 y.o. male  PRE-OPERATIVE DIAGNOSIS:  left knee osteoarthritis,Primary with a Contracture.  POST-OPERATIVE DIAGNOSIS:Same as Pre-Op  PROCEDURE:  Procedure(s) with comments: LEFT TOTAL KNEE ARTHROPLASTY (Left) - and release of Flexion Contracture.  SURGEON:  Surgeon(s) and Role:    * Ranee Gosselin, MD - Primary  PHYSICIAN ASSISTANT: Dimitri Ped PA  ASSISTANTS: Dimitri Ped PA  ANESTHESIA:   spinal and Optician, dispensing.  EBL: minimal   BLOOD ADMINISTERED:none  DRAINS: (one) Hemovact drain(s) in the Left Knee with  Suction Open   LOCAL MEDICATIONS USED:  OTHER 20cc of Exparel mixed with 20cc of Normal Saline.  SPECIMEN:  No Specimen  DISPOSITION OF SPECIMEN:  N/A  COUNTS:  YES  TOURNIQUET:  * Missing tourniquet times found for documented tourniquets in log: [id number] *  DICTATION: .Other Dictation: Dictation Number [id number]  PLAN OF CARE: Admit to inpatient   PATIENT DISPOSITION:  Stable in OR   Delay start of Pharmacological VTE agent (>24hrs) due to surgical blood loss or risk of bleeding: yes

## 2018-03-08 NOTE — Progress Notes (Signed)
Dr. Tacy Dura notified that pt had labs on 02/03/18 that are in paper chart. States these labs are okay prior to surgery and no pre-op labs are needed to be drawn today prior to surgery.

## 2018-03-08 NOTE — Discharge Instructions (Signed)
[facility] [address] [place], [place] [address] [phone]  TOTAL KNEE REPLACEMENT POSTOPERATIVE DIRECTIONS  Knee Rehabilitation, Guidelines Following Surgery  Results after knee surgery are often greatly improved when you follow the exercise, range of motion and muscle strengthening exercises prescribed by your doctor. Safety measures are also important to protect the knee from further injury. Any time any of these exercises cause you to have increased pain or swelling in your knee joint, decrease the amount until you are comfortable again and slowly increase them. If you have problems or questions, call your caregiver or physical therapist for advice.   HOME CARE INSTRUCTIONS  Remove items at home which could result in a fall. This includes throw rugs or furniture in walking pathways.   ICE to the affected knee every three hours for 30 minutes at a time and then as needed for pain and swelling.  Continue to use ice on the knee for pain and swelling from surgery. You may notice swelling that will progress down to the foot and ankle.  This is normal after surgery.  Elevate the leg when you are not up walking on it.    Continue to use the breathing machine which will help keep your temperature down.  It is common for your temperature to cycle up and down following surgery, especially at night when you are not up moving around and exerting yourself.  The breathing machine keeps your lungs expanded and your temperature down.  Do not place pillow under knee, focus on keeping the knee straight while resting  DIET You may resume your previous home diet once your are discharged from the hospital.  DRESSING / WOUND CARE / SHOWERING You may change your dressing every day with sterile gauze.  Please use good hand washing techniques before changing the dressing.  Do not use any lotions or creams on the incision until instructed by your surgeon. You may start showering once you  are discharged home but do not submerge the incision under water. Just pat the incision dry and apply a dry gauze dressing on daily. Change the surgical dressing daily and reapply a dry dressing each time.  ACTIVITY Walk with your walker as instructed. Use walker as long as suggested by your caregivers. Avoid periods of inactivity such as sitting longer than an hour when not asleep. This helps prevent blood clots.  You may resume a sexual relationship in one month or when given the OK by your doctor.  You may return to work once you are cleared by your doctor.  Do not drive a car for 6 weeks or until released by you surgeon.  Do not drive while taking narcotics.  WEIGHT BEARING Weight bearing as tolerated with assist device (walker, cane, etc) as directed, use it as long as suggested by your surgeon or therapist, typically at least 4-6 weeks.  POSTOPERATIVE CONSTIPATION PROTOCOL Constipation - defined medically as fewer than three stools per week and severe constipation as less than one stool per week.  One of the most common issues patients have following surgery is constipation.  Even if you have a regular bowel pattern at home, your normal regimen is likely to be disrupted due to multiple reasons following surgery.  Combination of anesthesia, postoperative narcotics, change in appetite and fluid intake all can affect your bowels.  In order to avoid complications following surgery, here are some recommendations in order to help you during your recovery period.  Colace (docusate) - Pick up an  over-the-counter form of Colace or another stool softener and take twice a day as long as you are requiring postoperative pain medications.  Take with a full glass of water daily.  If you experience loose stools or diarrhea, hold the colace until you stool forms back up.  If your symptoms do not get better within 1 week or if they get worse, check with your doctor.  Dulcolax (bisacodyl) - Pick up  over-the-counter and take as directed by the product packaging as needed to assist with the movement of your bowels.  Take with a full glass of water.  Use this product as needed if not relieved by Colace only.   MiraLax (polyethylene glycol) - Pick up over-the-counter to have on hand.  MiraLax is a solution that will increase the amount of water in your bowels to assist with bowel movements.  Take as directed and can mix with a glass of water, juice, soda, coffee, or tea.  Take if you go more than two days without a movement. Do not use MiraLax more than once per day. Call your doctor if you are still constipated or irregular after using this medication for 7 days in a row.  If you continue to have problems with postoperative constipation, please contact the office for further assistance and recommendations.  If you experience "the worst abdominal pain ever" or develop nausea or vomiting, please contact the office immediatly for further recommendations for treatment.  ITCHING  If you experience itching with your medications, try taking only a single pain pill, or even half a pain pill at a time.  You can also use Benadryl over the counter for itching or also to help with sleep.   TED HOSE STOCKINGS Wear the elastic stockings on both legs for three weeks following surgery during the day but you may remove then at night for sleeping.  MEDICATIONS See your medication summary on the After Visit Summary that the nursing staff will review with you prior to discharge.  You may have some home medications which will be placed on hold until you complete the course of blood thinner medication.  It is important for you to complete the blood thinner medication as prescribed by your surgeon.  Continue your approved medications as instructed at time of discharge.  PRECAUTIONS If you experience chest pain or shortness of breath - call 911 immediately for transfer to the hospital emergency department.  If you  develop a fever greater that 101 F, purulent drainage from wound, increased redness or drainage from wound, foul odor from the wound/dressing, or calf pain - CONTACT YOUR SURGEON.                                                   FOLLOW-UP APPOINTMENTS Make sure you keep all of your appointments after your operation with your surgeon and caregivers. You should call the office at the above phone number and make an appointment for approximately two weeks after the date of your surgery or on the date instructed by your surgeon outlined in the "After Visit Summary".   RANGE OF MOTION AND STRENGTHENING EXERCISES  Rehabilitation of the knee is important following a knee injury or an operation. After just a few days of immobilization, the muscles of the thigh which control the knee become weakened and shrink (atrophy). Knee exercises are designed  to build up the tone and strength of the thigh muscles and to improve knee motion. Often times heat used for twenty to thirty minutes before working out will loosen up your tissues and help with improving the range of motion but do not use heat for the first two weeks following surgery. These exercises can be done on a training (exercise) mat, on the floor, on a table or on a bed. Use what ever works the best and is most comfortable for you Knee exercises include:  Leg Lifts - While your knee is still immobilized in a splint or cast, you can do straight leg raises. Lift the leg to 60 degrees, hold for 3 sec, and slowly lower the leg. Repeat 10-20 times 2-3 times daily. Perform this exercise against resistance later as your knee gets better.  Quad and Hamstring Sets - Tighten up the muscle on the front of the thigh (Quad) and hold for 5-10 sec. Repeat this 10-20 times hourly. Hamstring sets are done by pushing the foot backward against an object and holding for 5-10 sec. Repeat as with quad sets.   Leg Slides: Lying on your back, slowly slide your foot toward your  buttocks, bending your knee up off the floor (only go as far as is comfortable). Then slowly slide your foot back down until your leg is flat on the floor again.  Angel Wings: Lying on your back spread your legs to the side as far apart as you can without causing discomfort.  A rehabilitation program following serious knee injuries can speed recovery and prevent re-injury in the future due to weakened muscles. Contact your doctor or a physical therapist for more information on knee rehabilitation.   IF YOU ARE TRANSFERRED TO A SKILLED REHAB FACILITY If the patient is transferred to a skilled rehab facility following release from the hospital, a list of the current medications will be sent to the facility for the patient to continue.  When discharged from the skilled rehab facility, please have the facility set up the patient's Home Health Physical Therapy prior to being released. Also, the skilled facility will be responsible for providing the patient with their medications at time of release from the facility to include their pain medication, the muscle relaxants, and their blood thinner medication. If the patient is still at the rehab facility at time of the two week follow up appointment, the skilled rehab facility will also need to assist the patient in arranging follow up appointment in our office and any transportation needs.  MAKE SURE YOU:  Understand these instructions.  Get help right away if you are not doing well or get worse.    Pick up stool softner and laxative for home use following surgery while on pain medications. Do not submerge incision under water. Please use good hand washing techniques while changing dressing each day. May shower starting three days after surgery. Please use a clean towel to pat the incision dry following showers. Continue to use ice for pain and swelling after surgery. Do not use any lotions or creams on the incision until instructed by your surgeon.

## 2018-03-08 NOTE — Anesthesia Procedure Notes (Signed)
Anesthesia Regional Block: Adductor canal block   Pre-Anesthetic Checklist: ,, timeout performed, Correct Patient, Correct Site, Correct Laterality, Correct Procedure, Correct Position, site marked, Risks and benefits discussed,  Surgical consent,  Pre-op evaluation,  At surgeon's request and post-op pain management  Laterality: Left  Prep: chloraprep       Needles:  Injection technique: Single-shot  Needle Type: Echogenic Stimulator Needle     Needle Length: 5cm  Needle Gauge: 22     Additional Needles:   Procedures:, nerve stimulator,,, ultrasound used (permanent image in chart),,,,  Narrative:  Start time: 03/08/2018 8:00 AM End time: 03/08/2018 8:05 AM Injection made incrementally with aspirations every 5 mL.  Performed by: Personally  Anesthesiologist: Bethena Midget, MD  Additional Notes: Functioning IV was confirmed and monitors were applied.  A 50mm 22ga Arrow echogenic stimulator needle was used. Sterile prep and drape,hand hygiene and sterile gloves were used. Ultrasound guidance: relevant anatomy identified, needle position confirmed, local anesthetic spread visualized around nerve(s)., vascular puncture avoided.  Image printed for medical record. Negative aspiration and negative test dose prior to incremental administration of local anesthetic. The patient tolerated the procedure well.

## 2018-03-08 NOTE — Interval H&P Note (Signed)
History and Physical Interval Note:  03/08/2018 8:15 AM  Ian Robinson  has presented today for surgery, with the diagnosis of left knee osteoarthritis  The various methods of treatment have been discussed with the patient and family. After consideration of risks, benefits and other options for treatment, the patient has consented to  Procedure(s) with comments: LEFT TOTAL KNEE ARTHROPLASTY (Left) - as a surgical intervention .  The patient's history has been reviewed, patient examined, no change in status, stable for surgery.  I have reviewed the patient's chart and labs.  Questions were answered to the patient's satisfaction.     Ranee Gosselin

## 2018-03-08 NOTE — Transfer of Care (Signed)
Immediate Anesthesia Transfer of Care Note  Patient: Ian Robinson  Procedure(s) Performed: LEFT TOTAL KNEE ARTHROPLASTY (Left Knee)  Patient Location: PACU  Anesthesia Type:Spinal and MAC combined with regional for post-op pain  Level of Consciousness: sedated and responds to stimulation  Airway & Oxygen Therapy: Patient Spontanous Breathing and Patient connected to face mask oxygen  Post-op Assessment: Report given to RN and Post -op Vital signs reviewed and stable  Post vital signs: Reviewed and stable  Last Vitals:  Vitals Value Taken Time  BP 107/68 03/08/2018 10:45 AM  Temp    Pulse 80 03/08/2018 10:45 AM  Resp 9 03/08/2018 10:45 AM  SpO2 100 % 03/08/2018 10:45 AM  Vitals shown include unvalidated device data.  Last Pain:  Vitals:   03/08/18 0700  TempSrc: Oral  PainSc:       Patients Stated Pain Goal: 4 (73/71/06 2694)  Complications: No apparent anesthesia complications

## 2018-03-08 NOTE — Anesthesia Postprocedure Evaluation (Signed)
Anesthesia Post Note  Patient: ABNER ARDIS  Procedure(s) Performed: LEFT TOTAL KNEE ARTHROPLASTY (Left Knee)     Patient location during evaluation: PACU Anesthesia Type: General Level of consciousness: awake and alert Pain management: pain level controlled Vital Signs Assessment: post-procedure vital signs reviewed and stable Respiratory status: spontaneous breathing, nonlabored ventilation, respiratory function stable and patient connected to nasal cannula oxygen Cardiovascular status: blood pressure returned to baseline and stable Postop Assessment: no apparent nausea or vomiting Anesthetic complications: no    Last Vitals:  Vitals:   03/08/18 0700 03/08/18 1045  BP: (!) 130/101   Pulse: 69   Resp: 18 15  Temp: 36.7 C 36.5 C  SpO2: 98%     Last Pain:  Vitals:   03/08/18 0700  TempSrc: Oral  PainSc:                  Zain Bingman

## 2018-03-09 ENCOUNTER — Encounter (HOSPITAL_COMMUNITY): Payer: Self-pay | Admitting: Orthopedic Surgery

## 2018-03-09 LAB — CBC
HCT: 42 % (ref 39.0–52.0)
Hemoglobin: 14.4 g/dL (ref 13.0–17.0)
MCH: 29.8 pg (ref 26.0–34.0)
MCHC: 34.3 g/dL (ref 30.0–36.0)
MCV: 87 fL (ref 78.0–100.0)
PLATELETS: 213 10*3/uL (ref 150–400)
RBC: 4.83 MIL/uL (ref 4.22–5.81)
RDW: 13.8 % (ref 11.5–15.5)
WBC: 11.7 10*3/uL — AB (ref 4.0–10.5)

## 2018-03-09 LAB — BASIC METABOLIC PANEL
ANION GAP: 8 (ref 5–15)
BUN: 11 mg/dL (ref 6–20)
CALCIUM: 9.3 mg/dL (ref 8.9–10.3)
CO2: 30 mmol/L (ref 22–32)
Chloride: 104 mmol/L (ref 98–111)
Creatinine, Ser: 0.89 mg/dL (ref 0.61–1.24)
Glucose, Bld: 145 mg/dL — ABNORMAL HIGH (ref 70–99)
POTASSIUM: 3.7 mmol/L (ref 3.5–5.1)
SODIUM: 142 mmol/L (ref 135–145)

## 2018-03-09 MED ORDER — METHOCARBAMOL 500 MG PO TABS: 500.0000 mg | ORAL_TABLET | Freq: Four times a day (QID) | ORAL | 0 refills | Status: DC

## 2018-03-09 MED ORDER — HYDROMORPHONE HCL 2 MG PO TABS: 2.0000 mg | ORAL_TABLET | ORAL | 0 refills | Status: DC | PRN

## 2018-03-09 MED ORDER — HYDROMORPHONE HCL 2 MG PO TABS
2.0000 mg | ORAL_TABLET | ORAL | Status: DC | PRN
  Administered 2018-03-09 (×2): 2 mg via ORAL
  Administered 2018-03-09 – 2018-03-10 (×4): 4 mg via ORAL
  Filled 2018-03-09 (×5): qty 2
  Filled 2018-03-09: qty 1

## 2018-03-09 MED ORDER — ASPIRIN EC 325 MG PO TBEC: 325.0000 mg | DELAYED_RELEASE_TABLET | Freq: Two times a day (BID) | ORAL | 0 refills | Status: DC

## 2018-03-09 NOTE — Care Management Note (Signed)
Case Management Note  Patient Details  Name: Ian Robinson MRN: [id number] Date of Birth: 1975/12/27  Subjective/Objective:  Spoke with patient at bedside. Confirmed plan for OP PT, already arranged. Has RW and 3n1. [phone]                  Action/Plan:   Expected Discharge Date:  03/10/18               Expected Discharge Plan:  OP Rehab  In-House Referral:  NA  Discharge planning Services  CM Consult, NA  Post Acute Care Choice:  NA Choice offered to:  Spouse  DME Arranged:  N/A DME Agency:  NA  HH Arranged:  NA HH Agency:  NA  Status of Service:  Completed, signed off  If discussed at Long Length of Stay Meetings, dates discussed:    Additional Comments:  Alexis Goodell, RN 03/09/2018, 11:05 AM

## 2018-03-09 NOTE — Progress Notes (Signed)
Physical Therapy Treatment Patient Details Name: Ian Robinson MRN: [id number] DOB: [birth year] Today's Date: 03/09/2018    History of Present Illness 42 YO male s/p L TKR on 03/08/18. PMH includes pre DM, HTN, arthritis, kidney stones, scoliosis, carpal tunnel release, R knee scope.     PT Comments    Mobility progressing. Unable to perform SLR . Plans Dc tomorrow with OPPT.   Follow Up Recommendations  Follow surgeon's recommendation for DC plan and follow-up therapies;Supervision for mobility/OOB     Equipment Recommendations  None recommended by PT    Recommendations for Other Services       Precautions / Restrictions Precautions Precautions: Fall;Knee Required Braces or Orthoses: Knee Immobilizer - Left Knee Immobilizer - Left: Discontinue once straight leg raise with < 10 degree lag    Mobility  Bed Mobility Overal bed mobility: Modified Independent                Transfers Overall transfer level: Modified independent                  Ambulation/Gait Ambulation/Gait assistance: Supervision Gait Distance (Feet): 440 Feet Assistive device: Rolling walker (2 wheeled) Gait Pattern/deviations: Step-to pattern;Step-through pattern     General Gait Details: cues for sequence and not taking long step with left.   Stairs Stairs: Yes Stairs assistance: Supervision Stair Management: Step to pattern;Forwards;One rail Left Number of Stairs: 2 General stair comments: cues for safety and sequence   Wheelchair Mobility    Modified Rankin (Stroke Patients Only)       Balance                                            Cognition Arousal/Alertness: Awake/alert                                            Exercises Total Joint Exercises Quad Sets: AROM;Left;Supine;10 reps Heel Slides: AAROM;Left;10 reps;Supine Straight Leg Raises: AAROM;Left;10 reps Goniometric ROM: 10-60 left knee felxion    General Comments         Pertinent Vitals/Pain Pain Score: 4  Pain Location: L knee  Pain Descriptors / Indicators: Guarding;Discomfort Pain Intervention(s): Monitored during session;Ice applied    Home Living                      Prior Function            PT Goals (current goals can now be found in the care plan section) Progress towards PT goals: Progressing toward goals    Frequency    7X/week      PT Plan Current plan remains appropriate    Co-evaluation              AM-PAC PT "6 Clicks" Daily Activity  Outcome Measure  Difficulty turning over in bed (including adjusting bedclothes, sheets and blankets)?: None Difficulty moving from lying on back to sitting on the side of the bed? : None Difficulty sitting down on and standing up from a chair with arms (e.g., wheelchair, bedside commode, etc,.)?: A Little Help needed moving to and from a bed to chair (including a wheelchair)?: A Little Help needed walking in hospital room?: A Little Help needed climbing 3-5 steps with a railing? :  A Little 6 Click Score: 20    End of Session Equipment Utilized During Treatment: Left knee immobilizer Activity Tolerance: Patient tolerated treatment well Patient left: in bed;with call bell/phone within reach;with family/visitor present Nurse Communication: Mobility status PT Visit Diagnosis: Other abnormalities of gait and mobility (R26.89);Difficulty in walking, not elsewhere classified (R26.2);Pain Pain - part of body: Knee     Time: 1357-1436 PT Time Calculation (min) (ACUTE ONLY): 39 min  Charges:  $Gait Training: 8-22 mins $Therapeutic Exercise: 8-22 mins $    Blanchard Kelch PT Acute Rehabilitation Services Pager [phone] Office [phone]    Rada Hay 03/09/2018, 2:50 PM

## 2018-03-09 NOTE — Progress Notes (Signed)
Subjective: 1 Day Post-Op Procedure(s) (LRB): LEFT TOTAL KNEE ARTHROPLASTY (Left) Patient reports pain as 3 on 0-10 scale.  Doing very well today.Drain Pulled.  Objective: Vital signs in last 24 hours: Temp:  [97.5 F (36.4 C)-98.2 F (36.8 C)] 98.1 F (36.7 C) (10/03 0536) Pulse Rate:  [56-81] 70 (10/03 0536) Resp:  [13-20] 18 (10/03 0536) BP: (99-129)/(71-102) 122/87 (10/03 0536) SpO2:  [97 %-100 %] 100 % (10/03 0536)  Intake/Output from previous day: 10/02 0701 - 10/03 0700 In: 5537.4 [P.O.:840; I.V.:4447.4; IV Piggyback:250] Out: 6310 [Urine:5600; Drains:690; Blood:20] Intake/Output this shift: No intake/output data recorded.  Recent Labs    03/09/18 0430  HGB 14.4   Recent Labs    03/09/18 0430  WBC 11.7*  RBC 4.83  HCT 42.0  PLT 213   Recent Labs    03/09/18 0430  NA 142  K 3.7  CL 104  CO2 30  BUN 11  CREATININE 0.89  GLUCOSE 145*  CALCIUM 9.3   No results for input(s): LABPT, INR in the last 72 hours.  Neurologically intact Dorsiflexion/Plantar flexion intact  Anticipated LOS equal to or greater than 2 midnights due to - Age 42 and older with one or more of the following:  - Obesity  - Expected need for hospital services (PT, OT, Nursing) required for safe  discharge  - Anticipated need for postoperative skilled nursing care or inpatient rehab  - Active co-morbidities: Chronic pain requiring opiods OR   - Unanticipated findings during/Post Surgery: None  - Patient is a high risk of re-admission due to: Non-elective hospital admission within previous 6 months   Assessment/Plan: 1 Day Post-Op Procedure(s) (LRB): LEFT TOTAL KNEE ARTHROPLASTY (Left) Up with therapy    Ranee Gosselin 03/09/2018, 7:13 AM

## 2018-03-09 NOTE — Progress Notes (Signed)
Physical Therapy Treatment Patient Details Name: Ian Robinson MRN: [id number] DOB: 07-Jan-1976 Today's Date: 03/09/2018    History of Present Illness 42 YO male s/p L TKR on 03/08/18. PMH includes pre DM, HTN, arthritis, kidney stones, scoliosis, carpal tunnel release, R knee scope.     PT Comments    The patient complains of stabbing pain medial left knee. Patient is progressing well. Plans DC tomorrow.    Follow Up Recommendations  Follow surgeon's recommendation for DC plan and follow-up therapies;Supervision for mobility/OOB     Equipment Recommendations  None recommended by PT    Recommendations for Other Services       Precautions / Restrictions Precautions Precautions: Fall;Knee Required Braces or Orthoses: Knee Immobilizer - Left Knee Immobilizer - Left: Discontinue once straight leg raise with < 10 degree lag Restrictions Weight Bearing Restrictions: No    Mobility  Bed Mobility               General bed mobility comments: in recliner  Transfers Overall transfer level: Modified independent               General transfer comment: patient in BR with no assist  Ambulation/Gait Ambulation/Gait assistance: Supervision Gait Distance (Feet): 200 Feet Assistive device: Rolling walker (2 wheeled) Gait Pattern/deviations: Step-to pattern Gait velocity: decr    General Gait Details: cues for sequence and not taking long step with left.   Stairs             Wheelchair Mobility    Modified Rankin (Stroke Patients Only)       Balance                                            Cognition Arousal/Alertness: Awake/alert Behavior During Therapy: Impulsive                                   General Comments: wants  to do his own way. In and out of BR, backing up using RW. cues for safety of door and chair legs.       Exercises Total Joint Exercises Heel Slides: AROM;20 reps  Knee extension x 20 using belt  to self assist.    General Comments        Pertinent Vitals/Pain Pain Score: 7  Pain Location: L knee medially Pain Descriptors / Indicators: Stabbing Pain Intervention(s): Monitored during session;Premedicated before session;Ice applied;Repositioned    Home Living                      Prior Function            PT Goals (current goals can now be found in the care plan section) Progress towards PT goals: Progressing toward goals    Frequency    7X/week      PT Plan Current plan remains appropriate    Co-evaluation              AM-PAC PT "6 Clicks" Daily Activity  Outcome Measure  Difficulty turning over in bed (including adjusting bedclothes, sheets and blankets)?: A Little Difficulty moving from lying on back to sitting on the side of the bed? : A Little Difficulty sitting down on and standing up from a chair with arms (e.g., wheelchair, bedside commode, etc,.)?: A Little Help  needed moving to and from a bed to chair (including a wheelchair)?: A Little Help needed walking in hospital room?: A Little Help needed climbing 3-5 steps with a railing? : A Lot 6 Click Score: 17    End of Session Equipment Utilized During Treatment: Left knee immobilizer Activity Tolerance: Patient tolerated treatment well Patient left: in chair;with call bell/phone within reach;with chair alarm set;with family/visitor present Nurse Communication: Mobility status PT Visit Diagnosis: Other abnormalities of gait and mobility (R26.89);Difficulty in walking, not elsewhere classified (R26.2);Pain Pain - part of body: Knee     Time: 0981-1914 PT Time Calculation (min) (ACUTE ONLY): 50 min  Charges:  $Gait Training: 8-22 mins $Therapeutic Exercise: 8-22 mins $Self Care/Home Management: 8-22                        Rada Hay 03/09/2018, 12:55 PM

## 2018-03-09 NOTE — Progress Notes (Signed)
OT Cancellation Note  Patient Details Name: Ian Robinson MRN: [id number] DOB: Aug 28, 1975   Cancelled Treatment:    Reason Eval/Treat Not Completed: OT screened, no needs identified, will sign off  Virlee Stroschein, [place] Kung 03/09/2018, 11:30 AM

## 2018-03-10 LAB — CBC
HCT: 41.1 % (ref 39.0–52.0)
Hemoglobin: 13.9 g/dL (ref 13.0–17.0)
MCH: 29.5 pg (ref 26.0–34.0)
MCHC: 33.8 g/dL (ref 30.0–36.0)
MCV: 87.3 fL (ref 78.0–100.0)
PLATELETS: 199 10*3/uL (ref 150–400)
RBC: 4.71 MIL/uL (ref 4.22–5.81)
RDW: 14.1 % (ref 11.5–15.5)
WBC: 10.3 10*3/uL (ref 4.0–10.5)

## 2018-03-10 LAB — BASIC METABOLIC PANEL
Anion gap: 10 (ref 5–15)
BUN: 10 mg/dL (ref 6–20)
CO2: 29 mmol/L (ref 22–32)
Calcium: 8.6 mg/dL — ABNORMAL LOW (ref 8.9–10.3)
Chloride: 99 mmol/L (ref 98–111)
Creatinine, Ser: 0.93 mg/dL (ref 0.61–1.24)
GFR calc Af Amer: >60 mL/min (ref >60)
GLUCOSE: 115 mg/dL — AB (ref 70–99)
POTASSIUM: 3.4 mmol/L — AB (ref 3.5–5.1)
Sodium: 138 mmol/L (ref 135–145)

## 2018-03-10 MED ORDER — POTASSIUM CHLORIDE CRYS ER 20 MEQ PO TBCR
40.0000 meq | EXTENDED_RELEASE_TABLET | Freq: Once | ORAL | Status: AC
  Administered 2018-03-10: 40 meq via ORAL
  Filled 2018-03-10: qty 2

## 2018-03-10 MED ORDER — METHOCARBAMOL 500 MG PO TABS
500.0000 mg | ORAL_TABLET | Freq: Four times a day (QID) | ORAL | Status: DC | PRN
  Administered 2018-03-10: 500 mg via ORAL
  Filled 2018-03-10: qty 1

## 2018-03-10 MED ORDER — DIPHENHYDRAMINE HCL 25 MG PO CAPS: 25.0000 mg | ORAL_CAPSULE | Freq: Four times a day (QID) | ORAL | Status: DC | PRN

## 2018-03-10 NOTE — Progress Notes (Signed)
Physical Therapy Treatment Patient Details Name: Ian Robinson MRN: [id number] DOB: 02-Oct-1975 Today's Date: 03/10/2018    History of Present Illness 42 YO male s/p L TKR on 03/08/18. PMH includes pre DM, HTN, arthritis, kidney stones, scoliosis, carpal tunnel release, R knee scope.     PT Comments    The patient tolerates very little WB and ROM left knee. The knee and leg are edematous. Patient  Reports high pain and nausea. RN aware. Provided HEP. Patient's first OPPT is Wednesday. Encouraged patient to be diligent at exercises   Follow Up Recommendations  Follow surgeon's recommendation for DC plan and follow-up therapies;Supervision for mobility/OOB     Equipment Recommendations  None recommended by PT    Recommendations for Other Services       Precautions / Restrictions Precautions Precautions: Fall;Knee Precaution Comments: did not wear KI    Mobility  Bed Mobility               General bed mobility comments: in recliner  Transfers   Equipment used: Rolling walker (2 wheeled) Transfers: Sit to/from Stand Sit to Stand: Supervision            Ambulation/Gait Ambulation/Gait assistance: Min guard Gait Distance (Feet): 50 Feet Assistive device: Rolling walker (2 wheeled) Gait Pattern/deviations: Step-to pattern;Antalgic     General Gait Details: patient placing very little weight on left leg   Stairs             Wheelchair Mobility    Modified Rankin (Stroke Patients Only)       Balance                                            Cognition Arousal/Alertness: Awake/alert                                            Exercises      General Comments        Pertinent Vitals/Pain Pain Score: 10-Worst pain ever Pain Location: L knee  Pain Descriptors / Indicators: Guarding;Discomfort Pain Intervention(s): Monitored during session;Premedicated before session;Ice applied    Home Living                       Prior Function            PT Goals (current goals can now be found in the care plan section) Progress towards PT goals: Progressing toward goals    Frequency    7X/week      PT Plan Current plan remains appropriate    Co-evaluation              AM-PAC PT "6 Clicks" Daily Activity  Outcome Measure  Difficulty turning over in bed (including adjusting bedclothes, sheets and blankets)?: A Lot Difficulty moving from lying on back to sitting on the side of the bed? : A Lot Difficulty sitting down on and standing up from a chair with arms (e.g., wheelchair, bedside commode, etc,.)?: A Lot Help needed moving to and from a bed to chair (including a wheelchair)?: A Lot Help needed walking in hospital room?: A Lot Help needed climbing 3-5 steps with a railing? : A Lot 6 Click Score: 12    End of Session  Activity Tolerance: Patient limited by pain Patient left: in chair;with call bell/phone within reach;with family/visitor present Nurse Communication: Mobility status PT Visit Diagnosis: Other abnormalities of gait and mobility (R26.89);Difficulty in walking, not elsewhere classified (R26.2);Pain Pain - part of body: Knee     Time: 1150-1208 PT Time Calculation (min) (ACUTE ONLY): 18 min  Charges:  $Gait Training: 8-22 mins                     Blanchard Kelch PT Acute Rehabilitation Services Pager [phone] Office [phone]    Rada Hay 03/10/2018, 12:54 PM

## 2018-03-10 NOTE — Progress Notes (Signed)
RN reviewed discharge instructions with patient and wife. All questions answered.   Paperwork and prescriptions given.   NT rolled patient down with all belongings to family car.  

## 2018-03-10 NOTE — Progress Notes (Signed)
Subjective: 2 Days Post-Op Procedure(s) (LRB): LEFT TOTAL KNEE ARTHROPLASTY (Left) Patient reports pain as 5 on 0-10 scale.  Main problem is pain.No other major issues.  Objective: Vital signs in last 24 hours: Temp:  [98.2 F (36.8 C)-98.9 F (37.2 C)] 98.9 F (37.2 C) (10/04 0536) Pulse Rate:  [81-93] 93 (10/04 0536) Resp:  [16-17] 17 (10/04 0536) BP: (115-138)/(71-92) 138/92 (10/04 0536) SpO2:  [98 %-100 %] 98 % (10/04 0536)  Intake/Output from previous day: 10/03 0701 - 10/04 0700 In: 1343 [P.O.:1060; I.V.:283] Out: 700 [Urine:700] Intake/Output this shift: No intake/output data recorded.  Recent Labs    03/09/18 0430 03/10/18 0501  HGB 14.4 13.9   Recent Labs    03/09/18 0430 03/10/18 0501  WBC 11.7* 10.3  RBC 4.83 4.71  HCT 42.0 41.1  PLT 213 199   Recent Labs    03/09/18 0430 03/10/18 0501  NA 142 138  K 3.7 3.4*  CL 104 99  CO2 30 29  BUN 11 10  CREATININE 0.89 0.93  GLUCOSE 145* 115*  CALCIUM 9.3 8.6*   No results for input(s): LABPT, INR in the last 72 hours.  Neurologically intactDressing is dry and no surrounding Cellulitus.  Assessment/Plan: 2 Days Post-Op Procedure(s) (LRB): LEFT TOTAL KNEE ARTHROPLASTY (Left) Up with therapy DC today.    Ranee Gosselin 03/10/2018, 7:15 AM

## 2018-03-13 NOTE — Discharge Summary (Signed)
Physician Discharge Summary   Patient ID: Ian Robinson MRN: [id number] DOB/AGE: 09/07/1975 42 y.o.  Admit date: 03/08/2018 Discharge date: 03/10/2018  Primary Diagnosis: Primary osteoarthritis left knee  Admission Diagnoses:  Past Medical History:  Diagnosis Date  . Arthritis   . Asthma   . GERD (gastroesophageal reflux disease)   . History of kidney stones   . Kidney stone   . Scoliosis    Discharge Diagnoses:   Active Problems:   History of total knee arthroplasty, left  Estimated body mass index is 31.57 kg/m as calculated from the following:   Height as of this encounter: 5' 10"  (1.778 m).   Weight as of this encounter: 99.8 kg.  Procedure:  Procedure(s) (LRB): LEFT TOTAL KNEE ARTHROPLASTY (Left)   Consults: None  HPI: Ian Robinson, 42 y.o. male, has a history of pain and functional disability in the left knee due to trauma and arthritis and has failed non-surgical conservative treatments for greater than 12 weeks to includeNSAID's and/or analgesics, corticosteriod injections, viscosupplementation injections, flexibility and strengthening excercises, supervised PT with diminished ADL's post treatment and activity modification.  Onset of symptoms was gradual, starting 4 years ago with gradually worsening course since that time. The patient noted prior procedures on the knee to include  arthroscopy and menisectomy on the left knee(s).  Patient currently rates pain in the left knee(s) at 7 out of 10 with activity. Patient has night pain, worsening of pain with activity and weight bearing, pain that interferes with activities of daily living, pain with passive range of motion, crepitus and joint swelling.  Patient has evidence of periarticular osteophytes and joint space narrowing by imaging studies. There is no active infection.  Laboratory Data: Admission on 03/08/2018, Discharged on 03/10/2018  Component Date Value Ref Range Status  . Glucose-Capillary 03/08/2018 95  70 - 99  mg/dL Final  . WBC 03/09/2018 11.7* 4.0 - 10.5 K/uL Final  . RBC 03/09/2018 4.83  4.22 - 5.81 MIL/uL Final  . Hemoglobin 03/09/2018 14.4  13.0 - 17.0 g/dL Final  . HCT 03/09/2018 42.0  39.0 - 52.0 % Final  . MCV 03/09/2018 87.0  78.0 - 100.0 fL Final  . MCH 03/09/2018 29.8  26.0 - 34.0 pg Final  . MCHC 03/09/2018 34.3  30.0 - 36.0 g/dL Final  . RDW 03/09/2018 13.8  11.5 - 15.5 % Final  . Platelets 03/09/2018 213  150 - 400 K/uL Final   Performed at [facility], [place] [address] [place], [place] [address]  . Sodium 03/09/2018 142  135 - 145 mmol/L Final  . Potassium 03/09/2018 3.7  3.5 - 5.1 mmol/L Final  . Chloride 03/09/2018 104  98 - 111 mmol/L Final  . CO2 03/09/2018 30  22 - 32 mmol/L Final  . Glucose, Bld 03/09/2018 145* 70 - 99 mg/dL Final  . BUN 03/09/2018 11  6 - 20 mg/dL Final  . Creatinine, Ser 03/09/2018 0.89  0.61 - 1.24 mg/dL Final  . Calcium 03/09/2018 9.3  8.9 - 10.3 mg/dL Final  . GFR calc non Af Amer 03/09/2018 >60  >60 mL/min Final  . GFR calc Af Amer 03/09/2018 >60  >60 mL/min Final   Comment: (NOTE) The eGFR has been calculated using the CKD EPI equation. This calculation has not been validated in all clinical situations. eGFR's persistently <60 mL/min signify possible Chronic Kidney Disease.   Georgiann Hahn gap 03/09/2018 8  5 - 15 Final   Performed at [facility],  [place] [address], [place], [place] [address]  . WBC 03/10/2018 10.3  4.0 - 10.5 K/uL Final  . RBC 03/10/2018 4.71  4.22 - 5.81 MIL/uL Final  . Hemoglobin 03/10/2018 13.9  13.0 - 17.0 g/dL Final  . HCT 03/10/2018 41.1  39.0 - 52.0 % Final  . MCV 03/10/2018 87.3  78.0 - 100.0 fL Final  . MCH 03/10/2018 29.5  26.0 - 34.0 pg Final  . MCHC 03/10/2018 33.8  30.0 - 36.0 g/dL Final  . RDW 03/10/2018 14.1  11.5 - 15.5 % Final  . Platelets 03/10/2018 199  150 - 400 K/uL Final   Performed at [facility], [place] [address] [place], [place] [address]  . Sodium  03/10/2018 138  135 - 145 mmol/L Final  . Potassium 03/10/2018 3.4* 3.5 - 5.1 mmol/L Final  . Chloride 03/10/2018 99  98 - 111 mmol/L Final  . CO2 03/10/2018 29  22 - 32 mmol/L Final  . Glucose, Bld 03/10/2018 115* 70 - 99 mg/dL Final  . BUN 03/10/2018 10  6 - 20 mg/dL Final  . Creatinine, Ser 03/10/2018 0.93  0.61 - 1.24 mg/dL Final  . Calcium 03/10/2018 8.6* 8.9 - 10.3 mg/dL Final  . GFR calc non Af Amer 03/10/2018 >60  >60 mL/min Final  . GFR calc Af Amer 03/10/2018 >60  >60 mL/min Final   Comment: (NOTE) The eGFR has been calculated using the CKD EPI equation. This calculation has not been validated in all clinical situations. eGFR's persistently <60 mL/min signify possible Chronic Kidney Disease.   Georgiann Hahn gap 03/10/2018 10  5 - 15 Final   Performed at [facility], [place] [address], [place], [place] [address]  Hospital Outpatient Visit on 03/01/2018  Component Date Value Ref Range Status  . MRSA, PCR 03/01/2018 NEGATIVE  NEGATIVE Final  . Staphylococcus aureus 03/01/2018 NEGATIVE  NEGATIVE Final   Comment: (NOTE) The Xpert SA Assay (FDA approved for NASAL specimens in patients 26 years of age and older), is one component of a comprehensive surveillance program. It is not intended to diagnose infection nor to guide or monitor treatment. Performed at [facility], [place] [address] [place], [place] [address]   . aPTT 03/01/2018 31  24 - 36 seconds Final   Performed at [facility], [place] [address] [place], [place] [address]  . Prothrombin Time 03/01/2018 12.4  11.4 - 15.2 seconds Final  . INR 03/01/2018 0.93   Final   Performed at [facility], [place] [address], [place], [place] [address]  . ABO/RH(D) 03/01/2018 A POS   Final  . Antibody Screen 03/01/2018 NEG   Final  . Sample Expiration 03/01/2018 03/11/2018   Final  . Extend sample reason 03/01/2018    Final                   Value:NO TRANSFUSIONS OR  PREGNANCY IN THE PAST 3 MONTHS Performed at [facility], [place] [address], [place], [place] [address]   . ABO/RH(D) 03/01/2018    Final                   Value:A POS Performed at [facility], [place] [address], [place], [place] [address]       Hospital Course: SIDDARTH HSIUNG is a 42 y.o. who was admitted to [facility]. They were brought to the operating room on 03/08/2018 and underwent Procedure(s): LEFT TOTAL KNEE ARTHROPLASTY.  Patient tolerated the procedure well and was later transferred to the recovery room and then to the orthopaedic floor for postoperative care.  They were given PO and IV analgesics for pain control following their surgery.  They were given 24 hours of postoperative antibiotics of  Anti-infectives (From admission, onward)   Start     Dose/Rate Route Frequency Ordered Stop   03/08/18 1430  ceFAZolin (ANCEF) IVPB 1 g/50 mL premix     1 g 100 mL/hr over 30 Minutes Intravenous Every 6 hours 03/08/18 1325 03/08/18 2035   03/08/18 0913  polymyxin B 500,000 Units, bacitracin 50,000 Units in sodium chloride 0.9 % 500 mL irrigation  Status:  Discontinued       As needed 03/08/18 0913 03/08/18 1041   03/08/18 0645  ceFAZolin (ANCEF) IVPB 2g/100 mL premix     2 g 200 mL/hr over 30 Minutes Intravenous On call to O.R. 03/08/18 0973 03/08/18 0837     and started on DVT prophylaxis in the form of Xarelto.   PT and OT were ordered for total joint protocol.  Discharge planning consulted to help with postop disposition and equipment needs.  Patient had a fair night on the evening of surgery.  They started to get up OOB with therapy on day one. Hemovac drain was pulled without difficulty. He did have some difficulty with pain management. Continued to work with therapy into day two.  Dressing was changed on day two and the incision was clean and dry. The patient had progressed with therapy and meeting their goals.  Incision was healing well.   Patient was seen in rounds and was ready to go home.   Diet: Cardiac diet and Diabetic diet Activity:WBAT Follow-up:in 2 weeks Disposition - Home Discharged Condition: stable   Discharge Instructions    Call MD / Call 911   Complete by:  As directed    If you experience chest pain or shortness of breath, CALL 911 and be transported to the hospital emergency room.  If you develope a fever above 101 F, pus (white drainage) or increased drainage or redness at the wound, or calf pain, call your surgeon's office.   Constipation Prevention   Complete by:  As directed    Drink plenty of fluids.  Prune juice may be helpful.  You may use a stool softener, such as Colace (over the counter) 100 mg twice a day.  Use MiraLax (over the counter) for constipation as needed.   Diet - low sodium heart healthy   Complete by:  As directed    Diet Carb Modified   Complete by:  As directed    Discharge instructions   Complete by:  As directed    [facility] [address] [place], [place] [address] [phone]  TOTAL KNEE REPLACEMENT POSTOPERATIVE DIRECTIONS  Knee Rehabilitation, Guidelines Following Surgery  Results after knee surgery are often greatly improved when you follow the exercise, range of motion and muscle strengthening exercises prescribed by your doctor. Safety measures are also important to protect the knee from further injury. Any time any of these exercises cause you to have increased pain or swelling in your knee joint, decrease the amount until you are comfortable again and slowly increase them. If you have problems or questions, call your caregiver or physical therapist for advice.   HOME CARE INSTRUCTIONS  Remove items at home which could result in a fall. This includes throw rugs or furniture in walking pathways.  ICE to the  affected knee every three hours for 30 minutes at a time and then as needed for pain and swelling.  Continue to use ice on the knee for pain and  swelling from surgery. You may notice swelling that will progress down to the foot and ankle.  This is normal after surgery.  Elevate the leg when you are not up walking on it.   Continue to use the breathing machine which will help keep your temperature down.  It is common for your temperature to cycle up and down following surgery, especially at night when you are not up moving around and exerting yourself.  The breathing machine keeps your lungs expanded and your temperature down. Do not place pillow under knee, focus on keeping the knee straight while resting  DIET You may resume your previous home diet once your are discharged from the hospital.  DRESSING / WOUND CARE / SHOWERING You may change your dressing every day with sterile gauze.  Please use good hand washing techniques before changing the dressing.  Do not use any lotions or creams on the incision until instructed by your surgeon. You may start showering once you are discharged home but do not submerge the incision under water. Just pat the incision dry and apply a dry gauze dressing on daily. Change the surgical dressing daily and reapply a dry dressing each time.  ACTIVITY Walk with your walker as instructed. Use walker as long as suggested by your caregivers. Avoid periods of inactivity such as sitting longer than an hour when not asleep. This helps prevent blood clots.  You may resume a sexual relationship in one month or when given the OK by your doctor.  You may return to work once you are cleared by your doctor.  Do not drive a car for 6 weeks or until released by you surgeon.  Do not drive while taking narcotics.  WEIGHT BEARING Weight bearing as tolerated with assist device (walker, cane, etc) as directed, use it as long as suggested by your surgeon or therapist, typically at least 4-6 weeks.  POSTOPERATIVE CONSTIPATION PROTOCOL Constipation - defined medically as fewer than three stools per week and severe  constipation as less than one stool per week.  One of the most common issues patients have following surgery is constipation.  Even if you have a regular bowel pattern at home, your normal regimen is likely to be disrupted due to multiple reasons following surgery.  Combination of anesthesia, postoperative narcotics, change in appetite and fluid intake all can affect your bowels.  In order to avoid complications following surgery, here are some recommendations in order to help you during your recovery period.  Colace (docusate) - Pick up an over-the-counter form of Colace or another stool softener and take twice a day as long as you are requiring postoperative pain medications.  Take with a full glass of water daily.  If you experience loose stools or diarrhea, hold the colace until you stool forms back up.  If your symptoms do not get better within 1 week or if they get worse, check with your doctor.  Dulcolax (bisacodyl) - Pick up over-the-counter and take as directed by the product packaging as needed to assist with the movement of your bowels.  Take with a full glass of water.  Use this product as needed if not relieved by Colace only.   MiraLax (polyethylene glycol) - Pick up over-the-counter to have on hand.  MiraLax is a solution that will increase the amount  of water in your bowels to assist with bowel movements.  Take as directed and can mix with a glass of water, juice, soda, coffee, or tea.  Take if you go more than two days without a movement. Do not use MiraLax more than once per day. Call your doctor if you are still constipated or irregular after using this medication for 7 days in a row.  If you continue to have problems with postoperative constipation, please contact the office for further assistance and recommendations.  If you experience "the worst abdominal pain ever" or develop nausea or vomiting, please contact the office immediatly for further recommendations for  treatment.  ITCHING  If you experience itching with your medications, try taking only a single pain pill, or even half a pain pill at a time.  You can also use Benadryl over the counter for itching or also to help with sleep.   TED HOSE STOCKINGS Wear the elastic stockings on both legs for three weeks following surgery during the day but you may remove then at night for sleeping.  MEDICATIONS See your medication summary on the "After Visit Summary" that the nursing staff will review with you prior to discharge.  You may have some home medications which will be placed on hold until you complete the course of blood thinner medication.  It is important for you to complete the blood thinner medication as prescribed by your surgeon.  Continue your approved medications as instructed at time of discharge.  PRECAUTIONS If you experience chest pain or shortness of breath - call 911 immediately for transfer to the hospital emergency department.  If you develop a fever greater that 101 F, purulent drainage from wound, increased redness or drainage from wound, foul odor from the wound/dressing, or calf pain - CONTACT YOUR SURGEON.                                                   FOLLOW-UP APPOINTMENTS Make sure you keep all of your appointments after your operation with your surgeon and caregivers. You should call the office at the above phone number and make an appointment for approximately two weeks after the date of your surgery or on the date instructed by your surgeon outlined in the "After Visit Summary".   RANGE OF MOTION AND STRENGTHENING EXERCISES  Rehabilitation of the knee is important following a knee injury or an operation. After just a few days of immobilization, the muscles of the thigh which control the knee become weakened and shrink (atrophy). Knee exercises are designed to build up the tone and strength of the thigh muscles and to improve knee motion. Often times heat used for twenty to  thirty minutes before working out will loosen up your tissues and help with improving the range of motion but do not use heat for the first two weeks following surgery. These exercises can be done on a training (exercise) mat, on the floor, on a table or on a bed. Use what ever works the best and is most comfortable for you Knee exercises include:  Leg Lifts - While your knee is still immobilized in a splint or cast, you can do straight leg raises. Lift the leg to 60 degrees, hold for 3 sec, and slowly lower the leg. Repeat 10-20 times 2-3 times daily. Perform this exercise against resistance later  as your knee gets better.  Quad and Hamstring Sets - Tighten up the muscle on the front of the thigh (Quad) and hold for 5-10 sec. Repeat this 10-20 times hourly. Hamstring sets are done by pushing the foot backward against an object and holding for 5-10 sec. Repeat as with quad sets.  Leg Slides: Lying on your back, slowly slide your foot toward your buttocks, bending your knee up off the floor (only go as far as is comfortable). Then slowly slide your foot back down until your leg is flat on the floor again. Angel Wings: Lying on your back spread your legs to the side as far apart as you can without causing discomfort.  A rehabilitation program following serious knee injuries can speed recovery and prevent re-injury in the future due to weakened muscles. Contact your doctor or a physical therapist for more information on knee rehabilitation.   IF YOU ARE TRANSFERRED TO A SKILLED REHAB FACILITY If the patient is transferred to a skilled rehab facility following release from the hospital, a list of the current medications will be sent to the facility for the patient to continue.  When discharged from the skilled rehab facility, please have the facility set up the patient's [place] prior to being released. Also, the skilled facility will be responsible for providing the patient with their  medications at time of release from the facility to include their pain medication, the muscle relaxants, and their blood thinner medication. If the patient is still at the rehab facility at time of the two week follow up appointment, the skilled rehab facility will also need to assist the patient in arranging follow up appointment in our office and any transportation needs.  MAKE SURE YOU:  Understand these instructions.  Get help right away if you are not doing well or get worse.    Pick up stool softner and laxative for home use following surgery while on pain medications. Do not submerge incision under water. Please use good hand washing techniques while changing dressing each day. May shower starting three days after surgery. Please use a clean towel to pat the incision dry following showers. Continue to use ice for pain and swelling after surgery. Do not use any lotions or creams on the incision until instructed by your surgeon.   Increase activity slowly as tolerated   Complete by:  As directed      Allergies as of 03/10/2018      Reactions   Diclofenac    Upset stomach   Nsaids    Gastric ulcer    Other    Hay fever  Pistachios and cashews-itchy tongue     Percocet [oxycodone-acetaminophen]    migraines      Medication List    STOP taking these medications   aspirin-acetaminophen-caffeine 250-250-65 MG tablet Commonly known as:  EXCEDRIN MIGRAINE     TAKE these medications   aspirin EC 325 MG tablet Take 1 tablet (325 mg total) by mouth 2 (two) times daily.   cetirizine 10 MG tablet Commonly known as:  ZYRTEC Take 10 mg by mouth daily.   HYDROmorphone 2 MG tablet Commonly known as:  DILAUDID Take 1-2 tablets (2-4 mg total) by mouth every 4 (four) hours as needed for severe pain (Q4-6 hours PRN).   methocarbamol 500 MG tablet Commonly known as:  ROBAXIN Take 1 tablet (500 mg total) by mouth 4 (four) times daily.   montelukast 10 MG tablet Commonly known as:  SINGULAIR Take 10 mg by mouth daily as needed (allergies).   omeprazole 20 MG capsule Commonly known as:  PRILOSEC Take 20 mg by mouth daily as needed (acid reflux).   OPCON-A 0.027-0.315 % Soln Generic drug:  Naphazoline-Pheniramine Place 2 drops into both eyes daily as needed (allergies).   PROAIR HFA 108 (90 Base) MCG/ACT inhaler Generic drug:  albuterol Inhale 2 Inhalers into the lungs every 6 (six) hours as needed for wheezing or shortness of breath.   SYRINGE 3CC/20GX1" 20G X 1" 3 ML Misc Use to inject Testosterone 2 times a month   testosterone cypionate 200 MG/ML injection Commonly known as:  DEPOTESTOSTERONE CYPIONATE Inject 1 mL (200 mg total) into the muscle every 14 (fourteen) days.      Follow-up Information    Latanya Maudlin, MD. Schedule an appointment as soon as possible for a visit in 2 week(s).   Specialty:  Orthopedic Surgery Contact information: [address] [place] [address] [phone]           Signed: Ardeen Jourdain, PA-C Orthopaedic Surgery 03/13/2018, 7:03 AM

## 2018-06-05 ENCOUNTER — Other Ambulatory Visit: Payer: Self-pay | Admitting: "Endocrinology

## 2018-06-10 LAB — TESTOS,TOTAL,FREE AND SHBG (FEMALE)
Free Testosterone: 186.3 pg/mL — ABNORMAL HIGH (ref 35.0–155.0)
Sex Hormone Binding: 14 nmol/L (ref 10–50)
TESTOSTERONE, TOTAL, LC-MS-MS: 796 ng/dL (ref 250–1100)

## 2018-06-21 ENCOUNTER — Ambulatory Visit (INDEPENDENT_AMBULATORY_CARE_PROVIDER_SITE_OTHER): Payer: BLUE CROSS/BLUE SHIELD | Admitting: "Endocrinology

## 2018-06-21 ENCOUNTER — Encounter: Payer: Self-pay | Admitting: "Endocrinology

## 2018-06-21 VITALS — BP 132/89 | HR 81 | Ht 70.0 in | Wt 222.0 lb

## 2018-06-21 DIAGNOSIS — E291 Testicular hypofunction: Secondary | ICD-10-CM | POA: Diagnosis not present

## 2018-06-21 DIAGNOSIS — R7303 Prediabetes: Secondary | ICD-10-CM | POA: Diagnosis not present

## 2018-06-21 DIAGNOSIS — I10 Essential (primary) hypertension: Secondary | ICD-10-CM

## 2018-06-21 NOTE — Progress Notes (Signed)
Endocrinology follow-up note   Subjective:    Patient ID: Gara Kroneraul R Hendricks, male    DOB: 05-29-1976, PCP System, Pcp Not In   Past Medical History:  Diagnosis Date  . Arthritis   . Asthma   . GERD (gastroesophageal reflux disease)   . History of kidney stones   . Kidney stone   . Scoliosis    Past Surgical History:  Procedure Laterality Date  . APPENDECTOMY    . arm fracture surgery    . CARPAL TUNNEL RELEASE     left hand  . FRACTURE SURGERY     left wrist  . KNEE ARTHROSCOPY     right knee  . TOTAL KNEE ARTHROPLASTY Left 03/08/2018   Procedure: LEFT TOTAL KNEE ARTHROPLASTY;  Surgeon: Ranee GosselinGioffre, Ronald, MD;  Location: [facility];  Service: Orthopedics;  Laterality: Left;  150min   Social History   Socioeconomic History  . Marital status: Married    Spouse name: Not on file  . Number of children: Not on file  . Years of education: Not on file  . Highest education level: Not on file  Occupational History  . Not on file  Social Needs  . Financial resource strain: Not on file  . Food insecurity:    Worry: Not on file    Inability: Not on file  . Transportation needs:    Medical: Not on file    Non-medical: Not on file  Tobacco Use  . Smoking status: Former Smoker    Last attempt to quit: 06/08/2007    Years since quitting: 11.0  . Smokeless tobacco: Never Used  Substance and Sexual Activity  . Alcohol use: Yes    Alcohol/week: 6.0 standard drinks    Types: 6 Cans of beer per week    Comment: occ 2 to 3 times a week  . Drug use: No  . Sexual activity: Not on file  Lifestyle  . Physical activity:    Days per week: Not on file    Minutes per session: Not on file  . Stress: Not on file  Relationships  . Social connections:    Talks on phone: Not on file    Gets together: Not on file    Attends religious service: Not on file    Active member of club or organization: Not on file    Attends meetings of clubs or organizations: Not on file    Relationship status: Not  on file  Other Topics Concern  . Not on file  Social History Narrative  . Not on file   Outpatient Encounter Medications as of 06/21/2018  Medication Sig  . aspirin EC 325 MG tablet Take 1 tablet (325 mg total) by mouth 2 (two) times daily.  . cetirizine (ZYRTEC) 10 MG tablet Take 10 mg by mouth daily.  . montelukast (SINGULAIR) 10 MG tablet Take 10 mg by mouth daily as needed (allergies).   . Naphazoline-Pheniramine (OPCON-A) 0.027-0.315 % SOLN Place 2 drops into both eyes daily as needed (allergies).  Marland Kitchen. PROAIR HFA 108 (90 Base) MCG/ACT inhaler Inhale 2 Inhalers into the lungs every 6 (six) hours as needed for wheezing or shortness of breath.  . Syringe/Needle, Disp, (SYRINGE 3CC/20GX1") 20G X 1" 3 ML MISC Use to inject Testosterone 2 times a month  . testosterone cypionate (DEPOTESTOSTERONE CYPIONATE) 200 MG/ML injection Inject 1 mL (200 mg total) into the muscle every 14 (fourteen) days.  . [DISCONTINUED] HYDROmorphone (DILAUDID) 2 MG tablet Take 1-2 tablets (2-4 mg  total) by mouth every 4 (four) hours as needed for severe pain (Q4-6 hours PRN).  . [DISCONTINUED] methocarbamol (ROBAXIN) 500 MG tablet Take 1 tablet (500 mg total) by mouth 4 (four) times daily.  . [DISCONTINUED] omeprazole (PRILOSEC) 20 MG capsule Take 20 mg by mouth daily as needed (acid reflux).   No facility-administered encounter medications on file as of 06/21/2018.    ALLERGIES: Allergies  Allergen Reactions  . Diclofenac     Upset stomach  . Nsaids     Gastric ulcer   . Other     Hay fever  Pistachios and cashews-itchy tongue    . Percocet [Oxycodone-Acetaminophen]     migraines   VACCINATION STATUS:  There is no immunization history on file for this patient.  HPI  Mr. Melroy is a 43 year old male with medical history as above.    -He is here to follow-up for hypogonadism with repeat labs.  He is currently on testosterone 200 mg IM every other week.  He feels better, reports compliance to his  medication.   -He did not father any children, saying that his wife has fertility problems. He was subjected to sperm analysis in the past when he was found to have "low sperm count and motility". He denies testicular injury, chemotherapy, radiation, nor STDs. He reiterated that they are not looking for fertility. His MRI is negative for pituitary/sella mass lesion nor adenoma. he has remote ( at age 78) past face injury with no associated intracranial injury. His prolactin level was also normal, see below.  He has never taken androgens, steroids, nor opiates. he has new c/o frequent sinus infections.   Review of Systems  Constitutional:  + steady weight since his last visit, no fatigue, no subjective hyperthermia/hypothermia Eyes: no blurry vision, no xerophthalmia ENT: no sore throat, no nodules palpated in throat, no dysphagia/odynophagia, no hoarseness Musculoskeletal: + Left knee replacement since his last visit  Skin: no rashes Neurological: no tremors/numbness/tingling/dizziness Psychiatric: no depression/anxiety  Objective:    BP 132/89   Pulse 81   Ht 5\' 10"  (1.778 m)   Wt 222 lb (100.7 kg)   BMI 31.85 kg/m   Wt Readings from Last 3 Encounters:  06/21/18 222 lb (100.7 kg)  03/08/18 220 lb (99.8 kg)  03/01/18 220 lb (99.8 kg)    Physical Exam   Constitutional: + obese with BMI of 31, not in acute distress.   Eyes: PERRLA, EOMI, no exophthalmos ENT: moist mucous membranes, no thyromegaly, no cervical lymphadenopathy Musculoskeletal: no deformities, strength intact in all 4 Skin: moist, warm, no rashes Neurological: no tremor with outstretched hands   Recent Results (from the past 2160 hour(s))  Testos,Total,Free and SHBG (Male)     Status: Abnormal   Collection Time: 06/05/18 10:34 AM  Result Value Ref Range   Testosterone, Total, LC-MS-MS 796 250 - 1,100 ng/dL    Comment: . For additional information, please refer  to http://education.questdiagnostics.com/faq/ TotalTestosteroneLCMSMSFAQ165 (This link is being provided for informational/ educational purposes only.) . This test was developed and its analytical performance characteristics have been determined by [facility] [place], [place]. It has not been cleared or approved by the U.S. Food and Drug Administration. This assay has been validated pursuant to the CLIA regulations and is used for clinical purposes. .    Free Testosterone 186.3 (H) 35.0 - 155.0 pg/mL    Comment: . This test was developed and its analytical performance characteristics have been determined by [facility], [place].  It has not been cleared or approved by the U.S. Food and Drug Administration. This assay has been validated pursuant to the CLIA regulations and is used for clinical purposes. .    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 His total testosterone at 796 , 3 days after his injection.    -This is acceptable range for testosterone replacement. -He wishes to continue to get testosterone replacement.  -Prior to his last visit, his CBC and hepatic panel have been favorable for continued testosterone replacement.     I discussed and continued  Testosterone 200mg  IM every 2 weeks , side effects discussed with him. He will RTN in 4 months with repeat labs. His MRI is negative for sella/pituitary mass lesions. His PRL is normal at 13.3.  2. Prediabetes: -Prior to his last visit A1c was 5.4% on September 21, 2017.  He will have a repeat A1c before his next visit.    -  Suggestion is made for him to avoid simple carbohydrates  from his diet including Cakes, Sweet Desserts / Pastries, Ice Cream, Soda (diet and regular), Sweet Tea, Candies, Chips, Cookies,  Store Bought Juices, Alcohol in Excess of  1-2 drinks a day, Artificial Sweeteners, and "Sugar-free" Products. This will help patient to have stable blood glucose profile and potentially avoid unintended weight gain.  3. Hypertension:  -His blood pressure is controlled to target.  He has no blood pressure medication at this time.  This is mainly due to recent significant weight loss. See above  - I advised patient to maintain close follow up with his PCP for primary care needs.  Follow up plan: Return in about 4 months (around 10/20/2018) for Follow up with Pre-visit Labs.  Marquis Lunch, MD Phone: [phone]  Fax: [phone]   -  This note was partially dictated with voice recognition software. Similar sounding words can be transcribed inadequately or may not  be corrected upon review.  06/21/2018, 8:51 AM

## 2018-09-08 ENCOUNTER — Encounter (HOSPITAL_COMMUNITY)
Admission: EM | Disposition: A | Discharge status: Home / Self Care | Payer: Self-pay | Source: Home / Self Care | Attending: Emergency Medicine

## 2018-09-08 ENCOUNTER — Other Ambulatory Visit: Payer: Self-pay

## 2018-09-08 ENCOUNTER — Emergency Department (HOSPITAL_COMMUNITY)
Admission: EM | Admit: 2018-09-08 | Discharge: 2018-09-09 | Disposition: A | Discharge status: Home / Self Care | Payer: BLUE CROSS/BLUE SHIELD | Attending: Emergency Medicine | Admitting: Emergency Medicine

## 2018-09-08 ENCOUNTER — Encounter (HOSPITAL_COMMUNITY): Payer: Self-pay

## 2018-09-08 DIAGNOSIS — R7303 Prediabetes: Secondary | ICD-10-CM | POA: Diagnosis not present

## 2018-09-08 DIAGNOSIS — Y92511 Restaurant or cafe as the place of occurrence of the external cause: Secondary | ICD-10-CM | POA: Diagnosis not present

## 2018-09-08 DIAGNOSIS — Y998 Other external cause status: Secondary | ICD-10-CM | POA: Insufficient documentation

## 2018-09-08 DIAGNOSIS — J45909 Unspecified asthma, uncomplicated: Secondary | ICD-10-CM | POA: Diagnosis not present

## 2018-09-08 DIAGNOSIS — Z79899 Other long term (current) drug therapy: Secondary | ICD-10-CM | POA: Insufficient documentation

## 2018-09-08 DIAGNOSIS — T18128A Food in esophagus causing other injury, initial encounter: Secondary | ICD-10-CM | POA: Diagnosis present

## 2018-09-08 DIAGNOSIS — M419 Scoliosis, unspecified: Secondary | ICD-10-CM | POA: Diagnosis not present

## 2018-09-08 DIAGNOSIS — Y93G1 Activity, food preparation and clean up: Secondary | ICD-10-CM | POA: Insufficient documentation

## 2018-09-08 DIAGNOSIS — Z87891 Personal history of nicotine dependence: Secondary | ICD-10-CM | POA: Diagnosis not present

## 2018-09-08 DIAGNOSIS — I10 Essential (primary) hypertension: Secondary | ICD-10-CM | POA: Insufficient documentation

## 2018-09-08 DIAGNOSIS — X58XXXA Exposure to other specified factors, initial encounter: Secondary | ICD-10-CM | POA: Diagnosis not present

## 2018-09-08 DIAGNOSIS — Z96652 Presence of left artificial knee joint: Secondary | ICD-10-CM | POA: Diagnosis not present

## 2018-09-08 DIAGNOSIS — Z7982 Long term (current) use of aspirin: Secondary | ICD-10-CM | POA: Diagnosis not present

## 2018-09-08 HISTORY — PX: FOREIGN BODY REMOVAL: SHX962

## 2018-09-08 HISTORY — PX: ESOPHAGOGASTRODUODENOSCOPY: SHX5428

## 2018-09-08 LAB — BASIC METABOLIC PANEL
Anion gap: 7 (ref 5–15)
BUN: 26 mg/dL — ABNORMAL HIGH (ref 6–20)
CO2: 19 mmol/L — ABNORMAL LOW (ref 22–32)
Calcium: 7.8 mg/dL — ABNORMAL LOW (ref 8.9–10.3)
Chloride: 115 mmol/L — ABNORMAL HIGH (ref 98–111)
Creatinine, Ser: 0.9 mg/dL (ref 0.61–1.24)
GFR calc Af Amer: >60 mL/min (ref >60)
GFR calc non Af Amer: >60 mL/min (ref >60)
Glucose, Bld: 87 mg/dL (ref 70–99)
Potassium: 3 mmol/L — ABNORMAL LOW (ref 3.5–5.1)
Sodium: 141 mmol/L (ref 135–145)

## 2018-09-08 LAB — CBC WITH DIFFERENTIAL/PLATELET
Abs Immature Granulocytes: 0.03 10*3/uL (ref 0.00–0.07)
Basophils Absolute: 0 10*3/uL (ref 0.0–0.1)
Basophils Relative: 0 %
Eosinophils Absolute: 0.2 10*3/uL (ref 0.0–0.5)
Eosinophils Relative: 3 %
HCT: 38.8 % — ABNORMAL LOW (ref 39.0–52.0)
Hemoglobin: 13.1 g/dL (ref 13.0–17.0)
Immature Granulocytes: 0 %
Lymphocytes Relative: 16 %
Lymphs Abs: 1.2 10*3/uL (ref 0.7–4.0)
MCH: 29.6 pg (ref 26.0–34.0)
MCHC: 33.8 g/dL (ref 30.0–36.0)
MCV: 87.6 fL (ref 80.0–100.0)
Monocytes Absolute: 0.4 10*3/uL (ref 0.1–1.0)
Monocytes Relative: 5 %
Neutro Abs: 5.8 10*3/uL (ref 1.7–7.7)
Neutrophils Relative %: 76 %
Platelets: 187 10*3/uL (ref 150–400)
RBC: 4.43 MIL/uL (ref 4.22–5.81)
RDW: 13.3 % (ref 11.5–15.5)
WBC: 7.6 10*3/uL (ref 4.0–10.5)
nRBC: 0 % (ref 0.0–0.2)

## 2018-09-08 SURGERY — EGD (ESOPHAGOGASTRODUODENOSCOPY)
Anesthesia: Moderate Sedation

## 2018-09-08 MED ORDER — SODIUM CHLORIDE 0.9 % IV BOLUS
1000.0000 mL | Freq: Once | INTRAVENOUS | Status: AC
  Administered 2018-09-08: 1000 mL via INTRAVENOUS

## 2018-09-08 MED ORDER — DIPHENHYDRAMINE HCL 50 MG/ML IJ SOLN
INTRAMUSCULAR | Status: AC
  Filled 2018-09-08: qty 1

## 2018-09-08 MED ORDER — MIDAZOLAM HCL (PF) 5 MG/ML IJ SOLN
INTRAMUSCULAR | Status: AC
  Filled 2018-09-08: qty 3

## 2018-09-08 MED ORDER — GLUCAGON HCL RDNA (DIAGNOSTIC) 1 MG IJ SOLR: 1.0000 mg | Freq: Once | INTRAMUSCULAR | Status: DC

## 2018-09-08 MED ORDER — ETOMIDATE 2 MG/ML IV SOLN
0.1500 mg/kg | Freq: Once | INTRAVENOUS | Status: AC
  Administered 2018-09-08: 15.1 mg via INTRAVENOUS

## 2018-09-08 MED ORDER — ETOMIDATE 2 MG/ML IV SOLN
INTRAVENOUS | Status: AC
  Administered 2018-09-08: 15.1 mg via INTRAVENOUS
  Filled 2018-09-08: qty 10

## 2018-09-08 MED ORDER — ETOMIDATE 2 MG/ML IV SOLN
0.1500 mg/kg | Freq: Once | INTRAVENOUS | Status: AC
  Administered 2018-09-08: 15.1 mg via INTRAVENOUS
  Filled 2018-09-08: qty 10

## 2018-09-08 MED ORDER — PROPOFOL 10 MG/ML IV BOLUS: 0.5000 mg/kg | Freq: Once | INTRAVENOUS | Status: DC

## 2018-09-08 MED ORDER — FENTANYL CITRATE (PF) 100 MCG/2ML IJ SOLN
INTRAMUSCULAR | Status: AC
  Filled 2018-09-08: qty 4

## 2018-09-08 NOTE — ED Provider Notes (Signed)
[facility]-EMERGENCY DEPT Provider Note   CSN: [id number] Arrival date & time: 09/08/18  2147    History   Chief Complaint No chief complaint on file.   HPI Ian Robinson is a 43 y.o. male.     Pt presents to the ED today with a food bolus.  He was eating steak which became stuck around 1730.  He initially went to a local ED in [place], but they were unable to remove the bolus until tomorrow morning.  They did give him a dose of glucagon IV, but it did not help.  Pt did not think he could wait, so he drove down here.  He said he usually comes to [facility] for all of his care.  The pt said he's been unable to swallow his spit.  He has never had this happen in the past.  He has never seen a GI doctor.     Past Medical History:  Diagnosis Date  . Arthritis   . Asthma   . GERD (gastroesophageal reflux disease)   . History of kidney stones   . Kidney stone   . Scoliosis     Patient Active Problem List   Diagnosis Date Noted  . History of total knee arthroplasty, left 03/08/2018  . Prediabetes 11/18/2015  . Essential hypertension, benign 11/18/2015  . 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 Vitamin D deficiency 04/25/2015    Past Surgical History:  Procedure Laterality Date  . APPENDECTOMY    . arm fracture surgery    . CARPAL TUNNEL RELEASE     left hand  . FRACTURE SURGERY     left wrist  . KNEE ARTHROSCOPY     right knee  . TOTAL KNEE ARTHROPLASTY Left 03/08/2018   Procedure: LEFT TOTAL KNEE ARTHROPLASTY;  Surgeon: Ranee Gosselin, MD;  Location: [facility];  Service: Orthopedics;  Laterality: Left;         Home Medications    Prior to Admission medications   Medication Sig Start Date End Date Taking? Authorizing Provider  aspirin EC 325 MG tablet Take 1 tablet (325 mg total) by mouth 2 (two) times daily. 03/09/18   Constable, Amber, PA-C  cetirizine (ZYRTEC) 10 MG tablet Take 10 mg by mouth daily.    [provider]  montelukast (SINGULAIR) 10  MG tablet Take 10 mg by mouth daily as needed (allergies).  01/13/18   [provider]  Naphazoline-Pheniramine (OPCON-A) 0.027-0.315 % SOLN Place 2 drops into both eyes daily as needed (allergies).    [provider]  PROAIR HFA 108 [phone] Base) MCG/ACT inhaler Inhale 2 Inhalers into the lungs every 6 (six) hours as needed for wheezing or shortness of breath. 01/13/18   [provider]  Syringe/Needle, Disp, (SYRINGE 3CC/20GX1") 20G X 1" 3 ML MISC Use to inject Testosterone 2 times a month 09/30/17   Nida, Denman George, MD  testosterone cypionate (DEPOTESTOSTERONE CYPIONATE) 200 MG/ML injection Inject 1 mL (200 mg total) into the muscle every 14 (fourteen) days. 02/15/18   Roma Kayser, MD    Family History Family History  Problem Relation Age of Onset  . Congestive Heart Failure Mother   . Diabetes Mother   . Arthritis Father   . Diabetes Father   . Dementia Paternal Grandmother   . Diabetes Paternal Grandfather     Social History Social History   Tobacco Use  . Smoking status: Former Smoker    Last attempt to quit: 06/08/2007    Years  since quitting: 11.2  . Smokeless tobacco: Never Used  Substance Use Topics  . Alcohol use: Yes    Alcohol/week: 6.0 standard drinks    Types: 6 Cans of beer per week    Comment: occ 2 to 3 times a week  . Drug use: No     Allergies   Diclofenac; Nsaids; Other; and Percocet [oxycodone-acetaminophen]   Review of Systems Review of Systems  Gastrointestinal:       "food stuck"  All other systems reviewed and are negative.    Physical Exam Updated Vital Signs BP 125/90   Pulse 90   Temp (P) 97.9 F (36.6 C) (Oral)   Resp 18   Wt 100.7 kg   SpO2 98%   BMI 31.85 kg/m   Physical Exam Vitals signs and nursing note reviewed.  Constitutional:      Appearance: Normal appearance.  HENT:     Head: Normocephalic and atraumatic.     Right Ear: External ear normal.     Left Ear: External ear normal.      Nose: Nose normal.     Mouth/Throat:     Mouth: Mucous membranes are moist.  Eyes:     Extraocular Movements: Extraocular movements intact.     Pupils: Pupils are equal, round, and reactive to light.  Neck:     Musculoskeletal: Normal range of motion.  Cardiovascular:     Rate and Rhythm: Normal rate and regular rhythm.     Pulses: Normal pulses.     Heart sounds: Normal heart sounds.  Pulmonary:     Effort: Pulmonary effort is normal.     Breath sounds: Normal breath sounds.  Abdominal:     General: Abdomen is flat.  Musculoskeletal: Normal range of motion.  Skin:    General: Skin is warm.     Capillary Refill: Capillary refill takes less than 2 seconds.  Neurological:     General: No focal deficit present.     Mental Status: He is alert and oriented to person, place, and time.  Psychiatric:        Mood and Affect: Mood normal.        Behavior: Behavior normal.      ED Treatments / Results  Labs (all labs ordered are listed, but only abnormal results are displayed) Labs Reviewed  BASIC METABOLIC PANEL - Abnormal; Notable for the following components:      Result Value   Potassium 3.0 (*)    Chloride 115 (*)    CO2 19 (*)    BUN 26 (*)    Calcium 7.8 (*)    All other components within normal limits  CBC WITH DIFFERENTIAL/PLATELET - Abnormal; Notable for the following components:   HCT 38.8 (*)    All other components within normal limits    EKG None  Radiology No results found.  Procedures Procedures (including critical care time)  Medications Ordered in ED Medications  sodium chloride 0.9 % bolus 1,000 mL (1,000 mLs Intravenous New Bag/Given 09/08/18 2239)  etomidate (AMIDATE) injection 15.1 mg (15.1 mg Intravenous Given 09/08/18 2337)  etomidate (AMIDATE) injection 15.1 mg (15.1 mg Intravenous Given 09/08/18 2355)  etomidate (AMIDATE) injection 15.1 mg (15.1 mg Intravenous Given 09/08/18 2351)  etomidate (AMIDATE) injection 15.1 mg (15.1 mg Intravenous Given  09/08/18 2359)     Initial Impression / Assessment and Plan / ED Course  I have reviewed the triage vital signs and the nursing notes.  Pertinent labs & imaging results that were  available during my care of the patient were reviewed by me and considered in my medical decision making (see chart for details).     Pt d/w Dr. Evette Cristal (GI) who will come in and remove bolus.  Dr. Evette Cristal removed the bolus.  Dr. Read Drivers did the sedation as I was tied up with a critical patient.  Pt is stable for d/c.  Return if worse.  Final Clinical Impressions(s) / ED Diagnoses   Final diagnoses:  Food impaction of esophagus, initial encounter    ED Discharge Orders    None       Jacalyn Lefevre, MD 09/09/18 0010

## 2018-09-08 NOTE — H&P (Signed)
The patient presents to the emergency room with a meat impaction in the esophagus.  We were asked to see him in regards to EGD for removal.  Patient unable to swallow and keep his saliva down.  No distress  Heart regular rhythm  Lungs clear  Abdomen soft nontender  Impression: Meat impaction in the esophagus  Plan: EGD with meat impaction removal

## 2018-09-08 NOTE — ED Triage Notes (Signed)
Pt reports getting a piece of steak stuck in his throat at around 530. Pt reports going to a hospital in [place] and was given Glucagon. Pt states that he was told that GI would not remove it until tomorrow morning so he left and came here.

## 2018-09-08 NOTE — ED Provider Notes (Signed)
.  Sedation Date/Time: 09/08/2018 11:28 PM Performed by: Nekeisha Aure, MD Authorized by: Kailah Pennel, MD   Consent:    Consent obtained:  Verbal   Consent given by:  Patient   Risks discussed:  Inadequate sedation and respiratory compromise necessitating ventilatory assistance and intubation Universal protocol:    Procedure explained and questions answered to patient or proxy's satisfaction: yes     Required blood products, implants, devices, and special equipment available: yes     Site/side marked: no     Immediately prior to procedure a time out was called: yes     Patient identity confirmation method:  Arm band and verbally with patient Indications:    Procedure performed:  Endoscopy   Procedure necessitating sedation performed by:  Different physician Pre-sedation assessment:    Time since last food or drink:  5:30PM   NPO status caution: urgency dictates proceeding with non-ideal NPO status     ASA classification: class 1 - normal, healthy patient     Neck mobility: normal     Mouth opening:  3 or more finger widths   Mallampati score:  II - soft palate, uvula, fauces visible   Pre-sedation assessments completed and reviewed: airway patency, cardiovascular function, mental status, nausea/vomiting and respiratory function     Pre-sedation assessment completed:  09/08/2018 11:35 PM Immediate pre-procedure details:    Reassessment: Patient reassessed immediately prior to procedure     Reviewed: vital signs     Verified: bag valve mask available, emergency equipment available, intubation equipment available, IV patency confirmed, oxygen available and suction available   Procedure details (see MAR for exact dosages):    Preoxygenation:  Nasal cannula   Sedation:  Etomidate   Analgesia:  None   Intra-procedure monitoring:  Blood pressure monitoring, cardiac monitor, continuous capnometry, continuous pulse oximetry, frequent LOC assessments and frequent vital sign checks  Intra-procedure events: none     Total Provider sedation time (minutes):  25 Post-procedure details:    Post-sedation assessment completed:  09/09/2018 12:35 AM   Attendance: Constant attendance by certified staff until patient recovered     Post-sedation assessments completed and reviewed: airway patency, cardiovascular function, mental status, nausea/vomiting and respiratory function     Patient is stable for discharge or admission: yes     Patient tolerance:  Tolerated well, no immediate complications Comments:     Etomidate 50 mg given x3 due to prolonged nature of procedure.      Paula Libra, MD 09/09/18 2235

## 2018-09-09 ENCOUNTER — Encounter (HOSPITAL_COMMUNITY): Payer: Self-pay

## 2018-09-09 NOTE — Op Note (Signed)
[facility] Patient Name: Ian Robinson Procedure Date: 09/08/2018 MRN: [id number] Attending MD: Graylin Shiver , MD Date of Birth: [birth year] CSN: [id number] Age: 43 Admit Type: Emergency Department Procedure:                Upper GI endoscopy Indications:              Foreign body in the esophagus Providers:                Graylin Shiver, MD, Jacquiline Doe, RN, Lawson Radar, Technician Referring MD:              Medicines:                per ER physician Complications:            No immediate complications. Estimated Blood Loss:     Estimated blood loss: none. Procedure:                Pre-Anesthesia Assessment:                           - Prior to the procedure, a History and Physical                            was performed, and patient medications and                            allergies were reviewed. The patient's tolerance of                            previous anesthesia was also reviewed. The risks                            and benefits of the procedure and the sedation                            options and risks were discussed with the patient.                            All questions were answered, and informed consent                            was obtained. Prior Anticoagulants: The patient has                            taken no previous anticoagulant or antiplatelet                            agents. ASA Grade Assessment: II - A patient with                            mild systemic disease. After reviewing the risks  and benefits, the patient was deemed in                            satisfactory condition to undergo the procedure.                           After obtaining informed consent, the endoscope was                            passed under direct vision. Throughout the                            procedure, the patient's blood pressure, pulse, and                            oxygen saturations were  monitored continuously. The                            [id number]-[id number] ([id number]) Olympus gastroscope was                            introduced through the mouth, and advanced to the                            body of the stomach. The upper GI endoscopy was                            accomplished without difficulty. The patient                            tolerated the procedure well. Scope In: Scope Out: Findings:      Food was found in the lower third of the esophagus. Removal of food was       accomplished. used a roth net and talon graspers. Patient vomited a       large chunk of meat. Esophagus cleared.      No gross lesions were noted [Site]. Impression:               - Food in the lower third of the esophagus. Removal                            was successful.                           - No gross lesions in the stomach. Moderate Sedation:      . Recommendation:           - Resume previous diet.                           - Continue present medications. Procedure Code(s):        --- Professional ---                           [phone], 52, Esophagogastroduodenoscopy, flexible,  transoral; with removal of foreign body(s) Diagnosis Code(s):        --- Professional ---                           T05.697X, Food in esophagus causing other injury,                            initial encounter                           T18.108A, Unspecified foreign body in esophagus                            causing other injury, initial encounter CPT copyright 2019 [employer]. All rights reserved. The codes documented in this report are preliminary and upon coder review may  be revised to meet current compliance requirements. Graylin Shiver, MD 09/09/2018 12:04:44 AM This report has been signed electronically. Number of Addenda: 0

## 2018-09-11 ENCOUNTER — Encounter (HOSPITAL_COMMUNITY): Payer: Self-pay | Admitting: Gastroenterology

## 2018-10-12 ENCOUNTER — Encounter: Payer: Self-pay | Admitting: "Endocrinology

## 2018-10-12 ENCOUNTER — Ambulatory Visit (INDEPENDENT_AMBULATORY_CARE_PROVIDER_SITE_OTHER): Payer: BLUE CROSS/BLUE SHIELD | Admitting: "Endocrinology

## 2018-10-12 ENCOUNTER — Other Ambulatory Visit: Payer: Self-pay

## 2018-10-12 DIAGNOSIS — I10 Essential (primary) hypertension: Secondary | ICD-10-CM | POA: Diagnosis not present

## 2018-10-12 DIAGNOSIS — R7303 Prediabetes: Secondary | ICD-10-CM

## 2018-10-12 DIAGNOSIS — E291 Testicular hypofunction: Secondary | ICD-10-CM | POA: Diagnosis not present

## 2018-10-12 LAB — TESTOSTERONE TOTAL,FREE,BIO, MALES
Albumin: 4.8 g/dL (ref 3.6–5.1)
Sex Hormone Binding: 13 nmol/L (ref 10–50)
Testosterone, Bioavailable: 526 ng/dL (ref >=110.0)
Testosterone, Free: 240.5 pg/mL — ABNORMAL HIGH (ref 46.0–224.0)
Testosterone: 867 ng/dL — ABNORMAL HIGH (ref 250–827)

## 2018-10-12 LAB — CBC WITH DIFFERENTIAL/PLATELET
ABSOLUTE MONOCYTES: 329 {cells}/uL (ref 200–950)
BASOS PCT: 1.3 %
Basophils Absolute: 61 cells/uL (ref 0–200)
EOS PCT: 8.7 %
Eosinophils Absolute: 409 cells/uL (ref 15–500)
HEMATOCRIT: 43.4 % (ref 38.5–50.0)
HEMOGLOBIN: 15 g/dL (ref 13.2–17.1)
LYMPHS ABS: 1086 {cells}/uL (ref 850–3900)
MCH: 29.4 pg (ref 27.0–33.0)
MCHC: 34.6 g/dL (ref 32.0–36.0)
MCV: 85.1 fL (ref 80.0–100.0)
MPV: 9.5 fL (ref 7.5–12.5)
Monocytes Relative: 7 %
NEUTROS PCT: 59.9 %
Neutro Abs: 2815 cells/uL (ref 1500–7800)
Platelets: 199 10*3/uL (ref 140–400)
RBC: 5.1 10*6/uL (ref 4.20–5.80)
RDW: 14.5 % (ref 11.0–15.0)
TOTAL LYMPHOCYTE: 23.1 %
WBC: 4.7 10*3/uL (ref 3.8–10.8)

## 2018-10-12 LAB — HEMOGLOBIN A1C
HEMOGLOBIN A1C: 5.4 %{Hb} (ref <5.7)
Mean Plasma Glucose: 108 (calc)
eAG (mmol/L): 6 (calc)

## 2018-10-12 LAB — PSA: PSA: 0.6 ng/mL (ref <=4.0)

## 2018-10-12 MED ORDER — TESTOSTERONE CYPIONATE 200 MG/ML IM SOLN: INTRAMUSCULAR | 0 refills | Status: DC

## 2018-10-12 NOTE — Progress Notes (Signed)
Endocrinology Telehealth Visit Follow up Note -During COVID -19 Pandemic  I connected with Ian Robinson on 10/12/2018   by telephone and verified that I am speaking with the correct person using two identifiers. Ian Robinson, 02-May-1976. he has verbally consented to this visit. All issues noted in this document were discussed and addressed. The format was not optimal for physical exam.    Subjective:    Patient ID: Ian Robinson, male    DOB: 02-May-1976, PCP System, Pcp Not In   Past Medical History:  Diagnosis Date  . Arthritis   . Asthma   . GERD (gastroesophageal reflux disease)   . History of kidney stones   . Kidney stone   . Scoliosis    Past Surgical History:  Procedure Laterality Date  . APPENDECTOMY    . arm fracture surgery    . CARPAL TUNNEL RELEASE     left hand  . ESOPHAGOGASTRODUODENOSCOPY N/A 09/08/2018   Procedure: ESOPHAGOGASTRODUODENOSCOPY (EGD);  Surgeon: Graylin ShiverGanem, Salem F, MD;  Location: [facility];  Service: Endoscopy;  Laterality: N/A;  . FOREIGN BODY REMOVAL N/A 09/08/2018   Procedure: FOREIGN BODY REMOVAL;  Surgeon: Graylin ShiverGanem, Salem F, MD;  Location: [facility];  Service: Endoscopy;  Laterality: N/A;  . FRACTURE SURGERY     left wrist  . KNEE ARTHROSCOPY     right knee  . TOTAL KNEE ARTHROPLASTY Left 03/08/2018   Procedure: LEFT TOTAL KNEE ARTHROPLASTY;  Surgeon: Ranee GosselinGioffre, Ronald, MD;  Location: [facility];  Service: Orthopedics;  Laterality: Left;  150min   Social History   Socioeconomic History  . Marital status: Married    Spouse name: Not on file  . Number of children: Not on file  . Years of education: Not on file  . Highest education level: Not on file  Occupational History  . Not on file  Social Needs  . Financial resource strain: Not on file  . Food insecurity:    Worry: Not on file    Inability: Not on file  . Transportation needs:    Medical: Not on file    Non-medical: Not on file  Tobacco Use  . Smoking  status: Former Smoker    Last attempt to quit: 06/08/2007    Years since quitting: 11.3  . Smokeless tobacco: Never Used  Substance and Sexual Activity  . Alcohol use: Yes    Alcohol/week: 6.0 standard drinks    Types: 6 Cans of beer per week    Comment: occ 2 to 3 times a week  . Drug use: No  . Sexual activity: Not on file  Lifestyle  . Physical activity:    Days per week: Not on file    Minutes per session: Not on file  . Stress: Not on file  Relationships  . Social connections:    Talks on phone: Not on file    Gets together: Not on file    Attends religious service: Not on file    Active member of club or organization: Not on file    Attends meetings of clubs or organizations: Not on file    Relationship status: Not on file  Other Topics Concern  . Not on file  Social History Narrative  . Not on file   Outpatient Encounter Medications as of 10/12/2018  Medication  Sig  . aspirin-acetaminophen-caffeine (EXCEDRIN MIGRAINE) 250-250-65 MG tablet Take 2 tablets by mouth every 6 (six) hours as needed for headache.  . busPIRone (BUSPAR) 7.5 MG tablet Take 7.5 mg by mouth 3 (three) times daily.  . cetirizine (ZYRTEC) 10 MG tablet Take 10 mg by mouth daily.  . diclofenac sodium (VOLTAREN) 1 % GEL Apply 2 g topically 4 (four) times daily as needed. Pain  . fluticasone (FLONASE) 50 MCG/ACT nasal spray Place 1 spray into both nostrils daily as needed for allergies or rhinitis.  Marland Kitchen montelukast (SINGULAIR) 10 MG tablet Take 10 mg by mouth daily.   Marland Kitchen PROAIR HFA 108 (90 Base) MCG/ACT inhaler Inhale 2 Inhalers into the lungs every 6 (six) hours as needed for wheezing or shortness of breath.  . Syringe/Needle, Disp, (SYRINGE 3CC/20GX1") 20G X 1" 3 ML MISC Use to inject Testosterone 2 times a month  . testosterone cypionate (DEPOTESTOSTERONE CYPIONATE) 200 MG/ML injection Inject alternating doses of 200mg  and 100mg  every 14 day ( total of 300mg  monthly).  . vitamin C (ASCORBIC ACID) 500 MG tablet  Take 500 mg by mouth daily.  . [DISCONTINUED] testosterone cypionate (DEPOTESTOSTERONE CYPIONATE) 200 MG/ML injection Inject 1 mL (200 mg total) into the muscle every 14 (fourteen) days.   No facility-administered encounter medications on file as of 10/12/2018.    ALLERGIES: Allergies  Allergen Reactions  . Diclofenac     Upset stomach  . Nsaids     Gastric ulcer   . Other     Hay fever  Pistachios and cashews-itchy tongue    . Percocet [Oxycodone-Acetaminophen]     migraines   VACCINATION STATUS:  There is no immunization history on file for this patient.  HPI  [name] is a 43 year old male with medical history as above.    -He is being engaged in telehealth via telephone for follow-up of  hypogonadism with repeat labs.  He is currently on testosterone 200 mg IM every other week.  He feels better, reports compliance to his medication.   -His previsit labs show evidence of over replacement with testosterone. -He did not father any children, saying that his wife has fertility problems. He was subjected to sperm analysis in the past when he was found to have "low sperm count and motility". He denies testicular injury, chemotherapy, radiation, nor STDs. He reiterated that they are not looking for fertility. His MRI is negative for pituitary/sella mass lesion nor adenoma. he has remote ( at age 71) past face injury with no associated intracranial injury. His prolactin level was also normal, see below.  He has never taken androgens, steroids, nor opiates. he has new c/o frequent sinus infections.   Review of Systems  Limited as above.  Objective:    There were no vitals taken for this visit.  Wt Readings from Last 3 Encounters:  09/08/18 222 lb (100.7 kg)  06/21/18 222 lb (100.7 kg)  03/08/18 220 lb (99.8 kg)    Physical Exam   Recent Results (from the past 2160 hour(s))  Basic metabolic panel     Status: Abnormal   Collection Time: 09/08/18 10:40 PM  Result Value Ref  Range   Sodium 141 135 - 145 mmol/L   Potassium 3.0 (L) 3.5 - 5.1 mmol/L   Chloride 115 (H) 98 - 111 mmol/L   CO2 19 (L) 22 - 32 mmol/L   Glucose, Bld 87 70 - 99 mg/dL   BUN 26 (H) 6 - 20 mg/dL   Creatinine, Ser 9.52  0.61 - 1.24 mg/dL   Calcium 7.8 (L) 8.9 - 10.3 mg/dL   GFR calc non Af Amer >60 >60 mL/min   GFR calc Af Amer >60 >60 mL/min   Anion gap 7 5 - 15    Comment: Performed at [facility], [address] [place], [place] [address]  CBC with Differential     Status: Abnormal   Collection Time: 09/08/18 10:40 PM  Result Value Ref Range   WBC 7.6 4.0 - 10.5 K/uL   RBC 4.43 4.22 - 5.81 MIL/uL   Hemoglobin 13.1 13.0 - 17.0 g/dL   HCT 60.4 (L) 54.0 - 98.1 %   MCV 87.6 80.0 - 100.0 fL   MCH 29.6 26.0 - 34.0 pg   MCHC 33.8 30.0 - 36.0 g/dL   RDW 19.1 47.8 - 29.5 %   Platelets 187 150 - 400 K/uL   nRBC 0.0 0.0 - 0.2 %   Neutrophils Relative % 76 %   Neutro Abs 5.8 1.7 - 7.7 K/uL   Lymphocytes Relative 16 %   Lymphs Abs 1.2 0.7 - 4.0 K/uL   Monocytes Relative 5 %   Monocytes Absolute 0.4 0.1 - 1.0 K/uL   Eosinophils Relative 3 %   Eosinophils Absolute 0.2 0.0 - 0.5 K/uL   Basophils Relative 0 %   Basophils Absolute 0.0 0.0 - 0.1 K/uL   Immature Granulocytes 0 %   Abs Immature Granulocytes 0.03 0.00 - 0.07 K/uL    Comment: Performed at [facility], [address], [place], [place] [address]  Hemoglobin A1c     Status: None   Collection Time: 10/11/18 10:27 AM  Result Value Ref Range   Hgb A1c MFr Bld 5.4 <5.7 % of total Hgb    Comment: For the purpose of screening for the presence of diabetes: . <5.7%       Consistent with the absence of diabetes 5.7-6.4%    Consistent with increased risk for diabetes             (prediabetes) > or =6.5%  Consistent with diabetes . This assay result is consistent with a decreased risk of diabetes. . Currently, no consensus exists regarding use of hemoglobin A1c for diagnosis of diabetes in  children. . According to American Diabetes Association (ADA) guidelines, hemoglobin A1c <7.0% represents optimal control in non-pregnant diabetic patients. Different metrics may apply to specific patient populations.  Standards of Medical Care in Diabetes(ADA). .    Mean Plasma Glucose 108 (calc)   eAG (mmol/L) 6.0 (calc)  PSA     Status: None   Collection Time: 10/11/18 10:27 AM  Result Value Ref Range   PSA 0.6 < OR = 4.0 ng/mL    Comment: The total PSA value from this assay system is  standardized against the WHO standard. The test  result will be approximately 20% lower when compared  to the equimolar-standardized total PSA (Beckman  Coulter). Comparison of serial PSA results should be  interpreted with this fact in mind. . This test was performed using the Siemens  chemiluminescent method. Values obtained from  different assay methods cannot be used interchangeably. PSA levels, regardless of value, should not be interpreted as absolute evidence of the presence or absence of disease.   CBC with Differential/Platelet     Status: None   Collection Time: 10/11/18 10:27 AM  Result Value Ref Range   WBC 4.7 3.8 - 10.8 Thousand/uL   RBC 5.10 4.20 - 5.80 Million/uL  Hemoglobin 15.0 13.2 - 17.1 g/dL   HCT 16.1 09.6 - 04.5 %   MCV 85.1 80.0 - 100.0 fL   MCH 29.4 27.0 - 33.0 pg   MCHC 34.6 32.0 - 36.0 g/dL   RDW 40.9 81.1 - 91.4 %   Platelets 199 140 - 400 Thousand/uL   MPV 9.5 7.5 - 12.5 fL   Neutro Abs 2,815 1,500 - 7,800 cells/uL   Lymphs Abs 1,086 850 - 3,900 cells/uL   Absolute Monocytes 329 200 - 950 cells/uL   Eosinophils Absolute 409 15 - 500 cells/uL   Basophils Absolute 61 0 - 200 cells/uL   Neutrophils Relative % 59.9 %   Total Lymphocyte 23.1 %   Monocytes Relative 7.0 %   Eosinophils Relative 8.7 %   Basophils Relative 1.3 %  Testosterone Total,Free,Bio, Males     Status: Abnormal   Collection Time: 10/11/18 10:27 AM  Result Value Ref Range    Testosterone 867 (H) 250 - 827 ng/dL   Albumin 4.8 3.6 - 5.1 g/dL   Sex Hormone Binding 13 10 - 50 nmol/L   Testosterone, Free 240.5 (H) 46.0 - 224.0 pg/mL   Testosterone, Bioavailable 526.0 110.0 - 575 ng/dL    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 His previsit labs show total testosterone of 867, and free testosterone of 240 both are showing above upper limit of normal.    -He wishes to continue to get testosterone replacement.  -Prior to his last visit, his CBC and hepatic panel have been favorable for continued testosterone replacement.     I discussed and ordered his testosterone supplement as follows 200 mg alternating with 100 mg every 2 weeks (to give a total of 300 mg monthly).   He will RTN in 4 months with repeat labs. His MRI is negative for sella/pituitary mass lesions. His PRL is normal at 13.3.  2. Prediabetes: -Prior to his last visit A1c was 5.4% on September 21, 2017.  He will have a repeat A1c before his next visit.   -  Suggestion is made for him to avoid simple carbohydrates  from his diet including Cakes, Sweet Desserts / Pastries, Ice Cream, Soda (diet and regular), Sweet Tea, Candies, Chips, Cookies, Store Bought Juices, Alcohol in Excess of  1-2 drinks a day, Artificial Sweeteners, and "Sugar-free" Products. This will help patient to have stable blood glucose profile and potentially avoid unintended weight gain.   3. Hypertension:  -His blood pressure is controlled to target.  He has no blood pressure medication at this time.  This is mainly due to recent significant weight loss. See above  - I advised patient to maintain close follow up with his PCP for primary care needs.   Time for this follow-up visit is 20 minutes Ian Kroner participated in the discussions, expressed understanding, and voiced agreement with the above plans.  All  questions were answered to his satisfaction. he is encouraged to contact clinic should he have any questions or concerns prior to his return visit.  Follow up plan: Return in about 4 months (around 02/12/2019) for Follow up with Pre-visit Labs.  Marquis Lunch, MD Phone: [phone]  Fax: [phone]   -  This note was partially dictated with voice recognition software. Similar sounding words can be transcribed inadequately or may not  be corrected upon review.  10/12/2018, 3:55 PM

## 2018-10-16 ENCOUNTER — Ambulatory Visit: Payer: BLUE CROSS/BLUE SHIELD | Admitting: "Endocrinology

## 2019-02-13 ENCOUNTER — Ambulatory Visit: Payer: BLUE CROSS/BLUE SHIELD | Admitting: "Endocrinology

## 2019-02-21 ENCOUNTER — Ambulatory Visit: Payer: BC Managed Care – PPO | Admitting: "Endocrinology

## 2019-02-22 LAB — PSA: PSA: 0.5 ng/mL (ref <=4.0)

## 2019-02-22 LAB — TESTOSTERONE, FREE: TESTOSTERONE FREE: 93.7 pg/mL (ref 46.0–224.0)

## 2019-02-28 ENCOUNTER — Other Ambulatory Visit: Payer: Self-pay

## 2019-02-28 ENCOUNTER — Ambulatory Visit (INDEPENDENT_AMBULATORY_CARE_PROVIDER_SITE_OTHER): Payer: BC Managed Care – PPO | Admitting: "Endocrinology

## 2019-02-28 ENCOUNTER — Encounter: Payer: Self-pay | Admitting: "Endocrinology

## 2019-02-28 DIAGNOSIS — E291 Testicular hypofunction: Secondary | ICD-10-CM

## 2019-02-28 DIAGNOSIS — R7303 Prediabetes: Secondary | ICD-10-CM

## 2019-02-28 MED ORDER — TESTOSTERONE CYPIONATE 200 MG/ML IM SOLN: INTRAMUSCULAR | 0 refills | Status: DC

## 2019-02-28 NOTE — Progress Notes (Signed)
02/28/2019                                Endocrinology Telehealth Visit Follow up Note -During COVID -19 Pandemic  I connected with Ian Robinson on 02/28/2019   by telephone and verified that I am speaking with the correct person using two identifiers. Ian Robinson, July 02, 1975. he has verbally consented to this visit. All issues noted in this document were discussed and addressed. The format was not optimal for physical exam.    Subjective:    Patient ID: Ian Robinson, male    DOB: [birth year], PCP System, Pcp Not In   Past Medical History:  Diagnosis Date  . Arthritis   . Asthma   . GERD (gastroesophageal reflux disease)   . History of kidney stones   . Kidney stone   . Scoliosis    Past Surgical History:  Procedure Laterality Date  . APPENDECTOMY    . arm fracture surgery    . CARPAL TUNNEL RELEASE     left hand  . ESOPHAGOGASTRODUODENOSCOPY N/A 09/08/2018   Procedure: ESOPHAGOGASTRODUODENOSCOPY (EGD);  Surgeon: Wonda Horner, MD;  Location: [facility];  Service: Endoscopy;  Laterality: N/A;  . FOREIGN BODY REMOVAL N/A 09/08/2018   Procedure: FOREIGN BODY REMOVAL;  Surgeon: Wonda Horner, MD;  Location: [facility];  Service: Endoscopy;  Laterality: N/A;  . FRACTURE SURGERY     left wrist  . KNEE ARTHROSCOPY     right knee  . TOTAL KNEE ARTHROPLASTY Left 03/08/2018   Procedure: LEFT TOTAL KNEE ARTHROPLASTY;  Surgeon: Latanya Maudlin, MD;  Location: [facility];  Service: Orthopedics;  Laterality: Left;  138min   Social History   Socioeconomic History  . Marital status: Married    Spouse name: Not on file  . Number of children: Not on file  . Years of education: Not on file  . Highest education level: Not on file  Occupational History  . Not on file  Social Needs  . Financial resource strain: Not on file  . Food insecurity    Worry: Not on file    Inability: Not on file  . Transportation needs    Medical: Not on file    Non-medical: Not on file  Tobacco Use  .  Smoking status: Former Smoker    Quit date: 06/08/2007    Years since quitting: 11.7  . Smokeless tobacco: Never Used  Substance and Sexual Activity  . Alcohol use: Yes    Alcohol/week: 6.0 standard drinks    Types: 6 Cans of beer per week    Comment: occ 2 to 3 times a week  . Drug use: No  . Sexual activity: Not on file  Lifestyle  . Physical activity    Days per week: Not on file    Minutes per session: Not on file  . Stress: Not on file  Relationships  . Social Herbalist on phone: Not on file    Gets together: Not on file    Attends religious service: Not on file    Active member of club or organization: Not on file    Attends meetings of clubs or organizations: Not on file    Relationship status: Not on file  Other Topics Concern  . Not on file  Social History Narrative  . Not on file   Outpatient Encounter Medications as of 02/28/2019  Medication Sig  .  aspirin-acetaminophen-caffeine (EXCEDRIN MIGRAINE) 250-250-65 MG tablet Take 2 tablets by mouth every 6 (six) hours as needed for headache.  . busPIRone (BUSPAR) 7.5 MG tablet Take 7.5 mg by mouth 3 (three) times daily.  . cetirizine (ZYRTEC) 10 MG tablet Take 10 mg by mouth daily.  . diclofenac sodium (VOLTAREN) 1 % GEL Apply 2 g topically 4 (four) times daily as needed. Pain  . fluticasone (FLONASE) 50 MCG/ACT nasal spray Place 1 spray into both nostrils daily as needed for allergies or rhinitis.  Marland Kitchen montelukast (SINGULAIR) 10 MG tablet Take 10 mg by mouth daily.   Marland Kitchen PROAIR HFA 108 (90 Base) MCG/ACT inhaler Inhale 2 Inhalers into the lungs every 6 (six) hours as needed for wheezing or shortness of breath.  . Syringe/Needle, Disp, (SYRINGE 3CC/20GX1") 20G X 1" 3 ML MISC Use to inject Testosterone 2 times a month  . testosterone cypionate (DEPOTESTOSTERONE CYPIONATE) 200 MG/ML injection Inject  100 mg every 7 days ( total of 400mg  monthly).  . vitamin C (ASCORBIC ACID) 500 MG tablet Take 500 mg by mouth daily.  .  [DISCONTINUED] testosterone cypionate (DEPOTESTOSTERONE CYPIONATE) 200 MG/ML injection Inject alternating doses of 200mg  and 100mg  every 14 day ( total of 300mg  monthly).   No facility-administered encounter medications on file as of 02/28/2019.    ALLERGIES: Allergies  Allergen Reactions  . Diclofenac     Upset stomach  . Nsaids     Gastric ulcer   . Other     Hay fever  Pistachios and cashews-itchy tongue    . Percocet [Oxycodone-Acetaminophen]     migraines   VACCINATION STATUS:  There is no immunization history on file for this patient.  HPI  [name] is a 43 year old male with medical history as above.    -He is being engaged in telehealth via telephone for follow-up of hypogonadism with  repeat labs.   He is currently on testosterone 100 mg IM every  week.  He feels better, reports compliance to his medication.    -He did not father any children, saying that his wife has fertility problems. He was subjected to sperm analysis in the past when he was found to have "low sperm count and motility". He denies testicular injury, chemotherapy, radiation, nor STDs. He reiterated that they are not looking for fertility. His MRI is negative for pituitary/sella mass lesion nor adenoma. he has remote ( at age 78) past face injury with no associated intracranial injury. His prolactin level was also normal, see below.  He has never taken androgens, steroids, nor opiates. he has new c/o frequent sinus infections.   Review of Systems  Limited as above.  Objective:    There were no vitals taken for this visit.  Wt Readings from Last 3 Encounters:  09/08/18 222 lb (100.7 kg)  06/21/18 222 lb (100.7 kg)  03/08/18 220 lb (99.8 kg)    Physical Exam   Recent Results (from the past 2160 hour(s))  PSA     Status: None   Collection Time: 02/19/19  8:06 AM  Result Value Ref Range   PSA 0.5 < OR = 4.0 ng/mL    Comment: The total PSA value from this assay system is  standardized  against the WHO standard. The test  result will be approximately 20% lower when compared  to the equimolar-standardized total PSA (Beckman  Coulter). Comparison of serial PSA results should be  interpreted with this fact in mind. . This test was performed using the Siemens  chemiluminescent method. Values obtained from  different assay methods cannot be used interchangeably. PSA levels, regardless of value, should not be interpreted as absolute evidence of the presence or absence of disease.   Testosterone, free     Status: None   Collection Time: 02/19/19  8:06 AM  Result Value Ref Range   TESTOSTERONE FREE 93.7 46.0 - 224.0 pg/mL    Comment: . The concentration of free testosterone is derived from a mathematical model using total testosterone by LCMSMS, sex hormone binding globulin and albumin. . This test was developed and its analytical performance characteristics have been determined by [facility] [place], [place]. It has not been cleared or approved by the U.S. Food and Drug Administration. This assay has been validated pursuant to the CLIA regulations and is used for clinical purposes. .     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 His previsit labs show free testosterone on target at 93.7.   -No evidence of adverse effects from testosterone replacement therapy.  -He wishes to continue to get testosterone replacement.  -Prior to his last visit, his CBC and hepatic panel have been favorable for continued testosterone replacement.    He is advised to continue testosterone supplement as follows 100 mg IM every 7 days.    He will RTN in 4 months with repeat labs. His MRI is negative for sella/pituitary mass lesions. His PRL is normal at 13.3.  2. Prediabetes: -Prior to his last visit A1c was 5.4% on September 21, 2017.  He will  have a repeat A1c before his next visit.    - he  admits there is a room for improvement in his diet and drink choices. -  Suggestion is made for him to avoid simple carbohydrates  from his diet including Cakes, Sweet Desserts / Pastries, Ice Cream, Soda (diet and regular), Sweet Tea, Candies, Chips, Cookies, Sweet Pastries,  Store Bought Juices, Alcohol in Excess of  1-2 drinks a day, Artificial Sweeteners, Coffee Creamer, and "Sugar-free" Products. This will help patient to have stable blood glucose profile and potentially avoid unintended weight gain.  3. Hypertension:  -His blood pressure is controlled to target.  He has no blood pressure medication at this time.  This is mainly due to recent significant weight loss. See above  - I advised patient to maintain close follow up with his PCP for primary care needs.    Time for this visit: 15 minutes. Gara Kroner  participated in the discussions, expressed understanding, and voiced agreement with the above plans.  All questions were answered to his satisfaction. he is encouraged to contact clinic should he have any questions or concerns prior to his return visit.  Follow up plan: Return in about 4 months (around 06/30/2019) for Follow up with Pre-visit Labs.  Marquis Lunch, MD Phone: [phone]  Fax: [phone]   -  This note was partially dictated with voice recognition software. Similar sounding words can be transcribed inadequately or may not  be corrected upon review.  02/28/2019, 5:42 PM

## 2019-07-03 ENCOUNTER — Ambulatory Visit: Payer: BC Managed Care – PPO | Admitting: "Endocrinology

## 2019-07-16 ENCOUNTER — Other Ambulatory Visit: Payer: Self-pay | Admitting: "Endocrinology

## 2019-07-16 LAB — CBC WITH DIFFERENTIAL/PLATELET
Absolute Monocytes: 413 cells/uL (ref 200–950)
Basophils Absolute: 59 cells/uL (ref 0–200)
Basophils Relative: 1 %
Eosinophils Absolute: 561 cells/uL — ABNORMAL HIGH (ref 15–500)
Eosinophils Relative: 9.5 %
HCT: 45.6 % (ref 38.5–50.0)
Hemoglobin: 15.6 g/dL (ref 13.2–17.1)
Lymphs Abs: 1605 cells/uL (ref 850–3900)
MCH: 29.2 pg (ref 27.0–33.0)
MCHC: 34.2 g/dL (ref 32.0–36.0)
MCV: 85.2 fL (ref 80.0–100.0)
MPV: 9.4 fL (ref 7.5–12.5)
Monocytes Relative: 7 %
Neutro Abs: 3263 cells/uL (ref 1500–7800)
Neutrophils Relative %: 55.3 %
Platelets: 213 10*3/uL (ref 140–400)
RBC: 5.35 10*6/uL (ref 4.20–5.80)
RDW: 13.7 % (ref 11.0–15.0)
Total Lymphocyte: 27.2 %
WBC: 5.9 10*3/uL (ref 3.8–10.8)

## 2019-07-20 LAB — TESTOS,TOTAL,FREE AND SHBG (FEMALE)
Free Testosterone: 157.8 pg/mL — ABNORMAL HIGH (ref 35.0–155.0)
Sex Hormone Binding: 15 nmol/L (ref 10–50)
Testosterone, Total, LC-MS-MS: 683 ng/dL (ref 250–1100)

## 2019-08-09 ENCOUNTER — Other Ambulatory Visit: Payer: Self-pay

## 2019-12-17 ENCOUNTER — Telehealth: Payer: Self-pay | Admitting: "Endocrinology

## 2019-12-17 ENCOUNTER — Other Ambulatory Visit: Payer: Self-pay | Admitting: "Endocrinology

## 2019-12-17 DIAGNOSIS — E291 Testicular hypofunction: Secondary | ICD-10-CM

## 2019-12-17 NOTE — Telephone Encounter (Signed)
Done

## 2019-12-17 NOTE — Telephone Encounter (Signed)
Pt requesting refill on his testosterone cypionate (DEPOTESTOSTERONE CYPIONATE) 200 MG/ML injection. Last OV 9/202 advised pt to do labs and schedule appt

## 2019-12-17 NOTE — Telephone Encounter (Signed)
Can you place lab order for pt

## 2019-12-18 NOTE — Telephone Encounter (Signed)
Do not send in. He needs to do labs and keep appointment.

## 2019-12-18 NOTE — Telephone Encounter (Signed)
Please advise if this is okay to send in 

## 2019-12-25 LAB — CBC WITH DIFFERENTIAL/PLATELET
Absolute Monocytes: 400 cells/uL (ref 200–950)
Basophils Absolute: 54 cells/uL (ref 0–200)
Basophils Relative: 0.7 %
Eosinophils Absolute: 23 cells/uL (ref 15–500)
Eosinophils Relative: 0.3 %
HCT: 46.1 % (ref 38.5–50.0)
Hemoglobin: 15.8 g/dL (ref 13.2–17.1)
Lymphs Abs: 1625 cells/uL (ref 850–3900)
MCH: 29.6 pg (ref 27.0–33.0)
MCHC: 34.3 g/dL (ref 32.0–36.0)
MCV: 86.3 fL (ref 80.0–100.0)
MPV: 9.6 fL (ref 7.5–12.5)
Monocytes Relative: 5.2 %
Neutro Abs: 5598 cells/uL (ref 1500–7800)
Neutrophils Relative %: 72.7 %
Platelets: 245 10*3/uL (ref 140–400)
RBC: 5.34 10*6/uL (ref 4.20–5.80)
RDW: 13.9 % (ref 11.0–15.0)
Total Lymphocyte: 21.1 %
WBC: 7.7 10*3/uL (ref 3.8–10.8)

## 2019-12-25 LAB — TESTOSTERONE TOTAL,FREE,BIO, MALES
Albumin: 4.9 g/dL (ref 3.6–5.1)
Sex Hormone Binding: 11 nmol/L (ref 10–50)
Testosterone, Bioavailable: 367.4 ng/dL (ref >=110.0)
Testosterone, Free: 164.7 pg/mL (ref 46.0–224.0)
Testosterone: 598 ng/dL (ref 250–827)

## 2019-12-25 LAB — PSA: PSA: 0.4 ng/mL (ref <=4.0)

## 2020-01-07 ENCOUNTER — Encounter: Payer: Self-pay | Admitting: "Endocrinology

## 2020-01-07 ENCOUNTER — Other Ambulatory Visit: Payer: Self-pay

## 2020-01-07 ENCOUNTER — Ambulatory Visit (INDEPENDENT_AMBULATORY_CARE_PROVIDER_SITE_OTHER): Payer: BC Managed Care – PPO | Admitting: "Endocrinology

## 2020-01-07 VITALS — BP 122/88 | HR 60 | Ht 70.0 in | Wt 231.6 lb

## 2020-01-07 DIAGNOSIS — E291 Testicular hypofunction: Secondary | ICD-10-CM | POA: Diagnosis not present

## 2020-01-07 DIAGNOSIS — I10 Essential (primary) hypertension: Secondary | ICD-10-CM | POA: Diagnosis not present

## 2020-01-07 DIAGNOSIS — R7303 Prediabetes: Secondary | ICD-10-CM

## 2020-01-07 MED ORDER — TESTOSTERONE CYPIONATE 200 MG/ML IM SOLN: INTRAMUSCULAR | 1 refills | Status: DC

## 2020-01-07 MED ORDER — "SYRINGE 20G X 1"" 3 ML MISC": 1 refills | Status: DC

## 2020-01-07 NOTE — Progress Notes (Signed)
02/28/2019     Endocrinology follow-up note   Subjective:    Patient ID: Ian Robinson, male    DOB: 1975-06-30, PCP System, Pcp Not In   Past Medical History:  Diagnosis Date  . Arthritis   . Asthma   . GERD (gastroesophageal reflux disease)   . History of kidney stones   . Kidney stone   . Scoliosis    Past Surgical History:  Procedure Laterality Date  . APPENDECTOMY    . arm fracture surgery    . CARPAL TUNNEL RELEASE     left hand  . ESOPHAGOGASTRODUODENOSCOPY N/A 09/08/2018   Procedure: ESOPHAGOGASTRODUODENOSCOPY (EGD);  Surgeon: Graylin ShiverGanem, Salem F, MD;  [facility];  Service: Endoscopy;  Laterality: N/A;  . FOREIGN BODY REMOVAL N/A 09/08/2018   Procedure: FOREIGN BODY REMOVAL;  Surgeon: Graylin ShiverGanem, Salem F, MD;  Location: [facility];  Service: Endoscopy;  Laterality: N/A;  . FRACTURE SURGERY     left wrist  . KNEE ARTHROSCOPY     right knee  . TOTAL KNEE ARTHROPLASTY Left 03/08/2018   Procedure: LEFT TOTAL KNEE ARTHROPLASTY;  Surgeon: Ranee GosselinGioffre, Ronald, MD;  Location: [facility];  Service: Orthopedics;  Laterality: Left;  150min   Social History   Socioeconomic History  . Marital status: Married    Spouse name: Not on file  . Number of children: Not on file  . Years of education: Not on file  . Highest education level: Not on file  Occupational History  . Not on file  Tobacco Use  . Smoking status: Former Smoker    Quit date: 06/08/2007    Years since quitting: 12.5  . Smokeless tobacco: Never Used  Vaping Use  . Vaping Use: Never used  Substance and Sexual Activity  . Alcohol use: Yes    Alcohol/week: 6.0 standard drinks    Types: 6 Cans of beer per week    Comment: occ 2 to 3 times a week  . Drug use: No  . Sexual activity: Not on file  Other Topics Concern  . Not on file  Social History Narrative  . Not on file   Social Determinants of Health   Financial Resource Strain:   . Difficulty of Paying Living Expenses:   Food Insecurity:   . Worried  About Programme researcher, broadcasting/film/videounning Out of Food in the Last Year:   . Baristaan Out of Food in the Last Year:   Transportation Needs:   . Freight forwarderLack of Transportation (Medical):   Marland Kitchen. Lack of Transportation (Non-Medical):   Physical Activity:   . Days of Exercise per Week:   . Minutes of Exercise per Session:   Stress:   . Feeling of Stress :   Social Connections:   . Frequency of Communication with Friends and Family:   . Frequency of Social Gatherings with Friends and Family:   . Attends Religious Services:   . Active Member of Clubs or Organizations:   . Attends BankerClub or Organization Meetings:   Marland Kitchen. Marital Status:    Outpatient Encounter Medications as of 01/07/2020  Medication Sig  . aspirin-acetaminophen-caffeine (EXCEDRIN MIGRAINE) 250-250-65 MG tablet Take 2 tablets by mouth every 6 (six) hours as needed for headache.  . busPIRone (BUSPAR) 7.5 MG tablet Take 7.5 mg by mouth 3 (three) times daily.  . cetirizine (ZYRTEC) 10 MG tablet Take 10 mg by mouth daily.  . fluticasone (FLONASE) 50 MCG/ACT nasal spray Place 1 spray into both nostrils daily as needed for allergies or rhinitis.  .Marland Kitchen  montelukast (SINGULAIR) 10 MG tablet Take 10 mg by mouth daily.   Marland Kitchen PROAIR HFA 108 (90 Base) MCG/ACT inhaler Inhale 2 Inhalers into the lungs every 6 (six) hours as needed for wheezing or shortness of breath.  . Syringe/Needle, Disp, (SYRINGE 3CC/20GX1") 20G X 1" 3 ML MISC Use to inject Testosterone 2 times a month  . testosterone cypionate (DEPOTESTOSTERONE CYPIONATE) 200 MG/ML injection Inject  100 mg every 7 days ( total of 400mg  monthly).  . vitamin C (ASCORBIC ACID) 500 MG tablet Take 500 mg by mouth daily.  . [DISCONTINUED] diclofenac sodium (VOLTAREN) 1 % GEL Apply 2 g topically 4 (four) times daily as needed. Pain  . [DISCONTINUED] Syringe/Needle, Disp, (SYRINGE 3CC/20GX1") 20G X 1" 3 ML MISC Use to inject Testosterone 2 times a month  . [DISCONTINUED] testosterone cypionate (DEPOTESTOSTERONE CYPIONATE) 200 MG/ML injection Inject   100 mg every 7 days ( total of 400mg  monthly).   No facility-administered encounter medications on file as of 01/07/2020.   ALLERGIES: Allergies  Allergen Reactions  . Diclofenac     Upset stomach  . Nsaids     Gastric ulcer   . Other     Hay fever  Pistachios and cashews-itchy tongue    . Percocet [Oxycodone-Acetaminophen]     migraines   VACCINATION STATUS:  There is no immunization history on file for this patient.  HPI  Ian Robinson is a 44 year old male with medical history as above.      -He is being seen in follow-up for hypogonadism with repeat labs.     He is currently on testosterone 100 mg IM every  week.  He feels better, reports compliance to his medication.    -He did not father any children, saying that his wife has fertility problems. He was subjected to sperm analysis in the past when he was found to have "low sperm count and motility". He denies testicular injury, chemotherapy, radiation, nor STDs. He reiterated that they are not looking for fertility. His MRI is negative for pituitary/sella mass lesion nor adenoma. he has remote ( at age 44) past face injury with no associated intracranial injury. His prolactin level was also normal, see below.  He has never taken androgens, steroids, nor opiates. he has new c/o frequent sinus infections.   Review of Systems  Limited as above.  Objective:    BP (!) 122/88   Pulse 60   Ht 5\' 10"  (1.778 m)   Wt (!) 231 lb 9.6 oz (105.1 kg)   BMI 33.23 kg/m   Wt Readings from Last 3 Encounters:  01/07/20 (!) 231 lb 9.6 oz (105.1 kg)  09/08/18 222 lb (100.7 kg)  06/21/18 222 lb (100.7 kg)    Physical Exam   Recent Results (from the past 2160 hour(s))  Testosterone Total,Free,Bio, Males     Status: None   Collection Time: 12/24/19  8:21 AM  Result Value Ref Range   Testosterone 598 250 - 827 ng/dL   Albumin 4.9 3.6 - 5.1 g/dL   Sex Hormone Binding 11 10 - 50 nmol/L   Testosterone, Free 164.7 46.0 - 224.0 pg/mL    Testosterone, Bioavailable 367.4 110.0 - 575 ng/dL  CBC with Differential/Platelet     Status: None   Collection Time: 12/24/19  8:21 AM  Result Value Ref Range   WBC 7.7 3.8 - 10.8 Thousand/uL   RBC 5.34 4.20 - 5.80 Million/uL   Hemoglobin 15.8 13.2 - 17.1 g/dL   HCT 2161 38 - 50 %  MCV 86.3 80.0 - 100.0 fL   MCH 29.6 27.0 - 33.0 pg   MCHC 34.3 32.0 - 36.0 g/dL   RDW 17.5 10.2 - 58.5 %   Platelets 245 140 - 400 Thousand/uL   MPV 9.6 7.5 - 12.5 fL   Neutro Abs 5,598 1,500 - 7,800 cells/uL   Lymphs Abs 1,625 850 - 3,900 cells/uL   Absolute Monocytes 400 200 - 950 cells/uL   Eosinophils Absolute 23 15 - 500 cells/uL   Basophils Absolute 54 0 - 200 cells/uL   Neutrophils Relative % 72.7 %   Total Lymphocyte 21.1 %   Monocytes Relative 5.2 %   Eosinophils Relative 0.3 %   Basophils Relative 0.7 %  PSA     Status: None   Collection Time: 12/24/19  8:21 AM  Result Value Ref Range   PSA 0.4 < OR = 4.0 ng/mL    Comment: The total PSA value from this assay system is  standardized against the WHO standard. The test  result will be approximately 20% lower when compared  to the equimolar-standardized total PSA (Beckman  Coulter). Comparison of serial PSA results should be  interpreted with this fact in mind. . This test was performed using the Siemens  chemiluminescent method. Values obtained from  different assay methods cannot be used interchangeably. PSA levels, regardless of value, should not be interpreted as absolute evidence of the presence or absence of disease.     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 His previsit labs show total testosterone of 598.   -No evidence of adverse effects from testosterone replacement therapy.  -He wishes to continue to get testosterone replacement.  -His CBC, PSA are favorable for continued testosterone  replacement.    He is advised to continue testosterone supplement as follows 100 mg IM every 7 days with plan to repeat labs and office visit in 4 months.    He will RTN in 4 months with repeat labs. His MRI is negative for sella/pituitary mass lesions. His PRL is normal at 13.3.  2. Prediabetes: -Prior to his last visit A1c was 5.4% on September 21, 2017.  He will have a repeat A1c before his next visit.    - he  admits there is a room for improvement in his diet and drink choices. -  Suggestion is made for him to avoid simple carbohydrates  from his diet including Cakes, Sweet Desserts / Pastries, Ice Cream, Soda (diet and regular), Sweet Tea, Candies, Chips, Cookies, Sweet Pastries,  Store Bought Juices, Alcohol in Excess of  1-2 drinks a day, Artificial Sweeteners, Coffee Creamer, and "Sugar-free" Products. This will help patient to have stable blood glucose profile and potentially avoid unintended weight gain.   3. Hypertension:  -His blood pressure is controlled to target.  He has no blood pressure medication at this time.  This is mainly due to recent significant weight loss. See above  - I advised patient to maintain close follow up with his PCP for primary care needs.   He is advised to maintain close follow-up with his PCP for primary care needs.     - Time spent on this patient care encounter:  20 minutes of which 50% was spent in  counseling and the rest reviewing  his current and  previous labs / studies and medications  doses and developing a plan for long term care.  Ian Kroner  participated in the discussions, expressed understanding, and voiced agreement with the above plans.  All questions were answered to his satisfaction. he is encouraged to contact clinic should he have any questions or concerns prior to his return visit.   Follow up plan: Return in about 4 months (around 05/08/2020) for Labs Today- Non-Fasting Ok.  Marquis Lunch, MD Phone: [phone]  Fax: [phone]    -  This note was partially dictated with voice recognition software. Similar sounding words can be transcribed inadequately or may not  be corrected upon review.  01/07/2020, 10:24 AM

## 2020-07-08 LAB — TESTOSTERONE, FREE, TOTAL, SHBG
Sex Hormone Binding: 16.6 nmol/L (ref 16.5–55.9)
Testosterone, Free: 17.4 pg/mL (ref 6.8–21.5)
Testosterone: 543 ng/dL (ref 264–916)

## 2020-07-10 ENCOUNTER — Encounter: Payer: Self-pay | Admitting: "Endocrinology

## 2020-07-10 ENCOUNTER — Ambulatory Visit (INDEPENDENT_AMBULATORY_CARE_PROVIDER_SITE_OTHER): Payer: BC Managed Care – PPO | Admitting: "Endocrinology

## 2020-07-10 ENCOUNTER — Other Ambulatory Visit: Payer: Self-pay

## 2020-07-10 VITALS — BP 122/86 | HR 80 | Ht 70.0 in | Wt 227.4 lb

## 2020-07-10 DIAGNOSIS — E291 Testicular hypofunction: Secondary | ICD-10-CM

## 2020-07-10 DIAGNOSIS — R7303 Prediabetes: Secondary | ICD-10-CM | POA: Diagnosis not present

## 2020-07-10 MED ORDER — TESTOSTERONE CYPIONATE 200 MG/ML IM SOLN: INTRAMUSCULAR | 0 refills | Status: DC

## 2020-07-10 NOTE — Progress Notes (Signed)
07/10/2020      Endocrinology follow-up note   Subjective:    Patient ID: Ian Robinson, male    DOB: 1976-02-23, PCP Pcp, No   Past Medical History:  Diagnosis Date  . Arthritis   . Asthma   . GERD (gastroesophageal reflux disease)   . History of kidney stones   . Kidney stone   . Scoliosis    Past Surgical History:  Procedure Laterality Date  . APPENDECTOMY    . arm fracture surgery    . CARPAL TUNNEL RELEASE     left hand  . ESOPHAGOGASTRODUODENOSCOPY N/A 09/08/2018   Procedure: ESOPHAGOGASTRODUODENOSCOPY (EGD);  Surgeon: Ian Shiver, MD;  Location: [facility];  Service: Endoscopy;  Laterality: N/A;  . FOREIGN BODY REMOVAL N/A 09/08/2018   Procedure: FOREIGN BODY REMOVAL;  Surgeon: Ian Shiver, MD;  Location: [facility];  Service: Endoscopy;  Laterality: N/A;  . FRACTURE SURGERY     left wrist  . KNEE ARTHROSCOPY     right knee  . TOTAL KNEE ARTHROPLASTY Left 03/08/2018   Procedure: LEFT TOTAL KNEE ARTHROPLASTY;  Surgeon: Ian Gosselin, MD;  Location: [facility];  Service: Orthopedics;  Laterality: Left;    Social History   Socioeconomic History  . Marital status: Married    Spouse name: Not on file  . Number of children: Not on file  . Years of education: Not on file  . Highest education level: Not on file  Occupational History  . Not on file  Tobacco Use  . Smoking status: Former Smoker    Quit date: 06/08/2007    Years since quitting: 13.0  . Smokeless tobacco: Never Used  Vaping Use  . Vaping Use: Never used  Substance and Sexual Activity  . Alcohol use: Yes    Alcohol/week: 6.0 standard drinks    Types: 6 Cans of beer per week    Comment: occ 2 to 3 times a week  . Drug use: No  . Sexual activity: Not on file  Other Topics Concern  . Not on file  Social History Narrative  . Not on file   Social Determinants of Health   Financial Resource Strain: Not on file  Food Insecurity: Not on file  Transportation Needs: Not on file  Physical  Activity: Not on file  Stress: Not on file  Social Connections: Not on file   Outpatient Encounter Medications as of 07/10/2020  Medication Sig  . aspirin-acetaminophen-caffeine (EXCEDRIN MIGRAINE) 250-250-65 MG tablet Take 2 tablets by mouth every 6 (six) hours as needed for headache.  . busPIRone (BUSPAR) 7.5 MG tablet Take 7.5 mg by mouth 3 (three) times daily.  . cetirizine (ZYRTEC) 10 MG tablet Take 10 mg by mouth daily.  . fluticasone (FLONASE) 50 MCG/ACT nasal spray Place 1 spray into both nostrils daily as needed for allergies or rhinitis.  Marland Kitchen montelukast (SINGULAIR) 10 MG tablet Take 10 mg by mouth daily.   Marland Kitchen PROAIR HFA 108 (90 Base) MCG/ACT inhaler Inhale 2 Inhalers into the lungs every 6 (six) hours as needed for wheezing or shortness of breath.  . Syringe/Needle, Disp, (SYRINGE 3CC/20GX1") 20G X 1" 3 ML MISC Use to inject Testosterone 2 times a month  . testosterone cypionate (DEPOTESTOSTERONE CYPIONATE) 200 MG/ML injection Inject  200 mg every 14 days ( total of 400mg  monthly).  . vitamin C (ASCORBIC ACID) 500 MG tablet Take 500 mg by mouth daily. (Patient not taking: Reported on 07/10/2020)  . [DISCONTINUED] testosterone cypionate (DEPOTESTOSTERONE  CYPIONATE) 200 MG/ML injection Inject  100 mg every 7 days ( total of 400mg  monthly).   No facility-administered encounter medications on file as of 07/10/2020.   ALLERGIES: Allergies  Allergen Reactions  . Diclofenac     Upset stomach  . Nsaids     Gastric ulcer   . Other     Hay fever  Pistachios and cashews-itchy tongue    . Percocet [Oxycodone-Acetaminophen]     migraines   VACCINATION STATUS:  There is no immunization history on file for this patient.  HPI  Ian Robinson is a 45 year old male patient with medical history as above.     -He is being seen in follow-up for hypogonadism with repeat labs.     He is currently on testosterone 100 mg IM every  week.  He feels better, reports compliance to his medication.  However,  he wants to decrease the frequency of his injections by doing every other week instead of every week.  -He did not father any children, saying that his wife has fertility problems. He was subjected to sperm analysis in the past when he was found to have "low sperm count and motility". He denies testicular injury, chemotherapy, radiation, nor STDs. He reiterated that they are not looking for fertility. His MRI is negative for pituitary/sella mass lesion nor adenoma. he has remote ( at age 75) past face injury with no associated intracranial injury. His prolactin level was also normal, see below.  He has never taken androgens, steroids, nor opiates. he has new c/o frequent sinus infections.   Review of Systems  Limited as above.  Objective:    BP 122/86   Pulse 80   Ht 5\' 10"  (1.778 m)   Wt 227 lb 6.4 oz (103.1 kg)   BMI 32.63 kg/m   Wt Readings from Last 3 Encounters:  07/10/20 227 lb 6.4 oz (103.1 kg)  01/07/20 (!) 231 lb 9.6 oz (105.1 kg)  09/08/18 222 lb (100.7 kg)    Physical Exam   Recent Results (from the past 2160 hour(s))  Testosterone, Free, Total, SHBG     Status: None   Collection Time: 07/07/20  9:21 AM  Result Value Ref Range   Testosterone 543 264 - 916 ng/dL    Comment: Adult male reference interval is based on a population of healthy nonobese males (BMI <30) between 74 and 65 years old. Travison, et.al. JCEM [phone]. PMID: 24.    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 His previsit labs show total testosterone of 543 with no evidence of adverse effects from testosterone treatment so far.  -He wishes to continue to get testosterone replacement, however he wishes to decrease his injection frequencies.  -His CBC, PSA are favorable  for continued testosterone replacement.    He is advised to increase his testosterone to 200 mg IM every 14 days  with plan to repeat labs and office visit in 4 months.    He will RTN in 4 months with repeat labs. His MRI is negative for sella/pituitary mass lesions. His PRL is normal at 13.3.  2. Prediabetes: -Prior to his last visit A1c was 5.4% on September 21, 2017.  He will be considered for point-of-care A1c during his next visit.  - he acknowledges that  there is a room for improvement in his food and drink choices. - Suggestion is made for him to avoid simple carbohydrates  from his diet including Cakes, Sweet Desserts, Ice Cream, Soda (diet and regular), Sweet Tea, Candies, Chips, Cookies, Store Bought Juices, Alcohol in Excess of  1-2 drinks a day, Artificial Sweeteners,  Coffee Creamer, and "Sugar-free" Products, Lemonade. This will help patient to have more stable blood glucose profile and potentially avoid unintended weight gain.   3. Hypertension:  -His blood pressure is controlled to target.   He has no blood pressure medication at this time.  This is mainly due to recent significant weight loss. See above   - I advised patient to maintain close follow up with his PCP for primary care needs.   He is advised to maintain close follow-up with his PCP for primary care needs.     - Time spent on this patient care encounter:  30 minutes of which 50% was spent in  counseling and the rest reviewing  his current and  previous labs / studies and medications  doses and developing a plan for long term care. Gara Kroner  participated in the discussions, expressed understanding, and voiced agreement with the above plans.  All questions were answered to his satisfaction. he is encouraged to contact clinic should he have any questions or concerns prior to his return visit.   Follow up plan: Return in about 4 months (around 11/07/2020) for Fasting Labs  in AM B4 8, A1c -NV.  Marquis Lunch,  MD Phone: [phone]  Fax: [phone]   -  This note was partially dictated with voice recognition software. Similar sounding words can be transcribed inadequately or may not  be corrected upon review.  07/10/2020, 1:41 PM

## 2020-11-05 LAB — CBC WITH DIFFERENTIAL/PLATELET
Basophils Absolute: 0.1 10*3/uL (ref 0.0–0.2)
Basos: 1 %
EOS (ABSOLUTE): 0.4 10*3/uL (ref 0.0–0.4)
Eos: 7 %
Hematocrit: 49.7 % (ref 37.5–51.0)
Hemoglobin: 16.5 g/dL (ref 13.0–17.7)
Immature Grans (Abs): 0 10*3/uL (ref 0.0–0.1)
Immature Granulocytes: 0 %
Lymphocytes Absolute: 1.3 10*3/uL (ref 0.7–3.1)
Lymphs: 24 %
MCH: 29.7 pg (ref 26.6–33.0)
MCHC: 33.2 g/dL (ref 31.5–35.7)
MCV: 90 fL (ref 79–97)
Monocytes Absolute: 0.4 10*3/uL (ref 0.1–0.9)
Monocytes: 8 %
Neutrophils Absolute: 3.1 10*3/uL (ref 1.4–7.0)
Neutrophils: 60 %
Platelets: 182 10*3/uL (ref 150–450)
RBC: 5.55 x10E6/uL (ref 4.14–5.80)
RDW: 14 % (ref 11.6–15.4)
WBC: 5.2 10*3/uL (ref 3.4–10.8)

## 2020-11-05 LAB — TESTOSTERONE, FREE, TOTAL, SHBG
Sex Hormone Binding: 17.5 nmol/L (ref 16.5–55.9)
Testosterone, Free: 21.9 pg/mL — ABNORMAL HIGH (ref 6.8–21.5)
Testosterone: 731 ng/dL (ref 264–916)

## 2020-11-05 LAB — COMPREHENSIVE METABOLIC PANEL
ALT: 33 IU/L (ref 0–44)
AST: 21 IU/L (ref 0–40)
Albumin/Globulin Ratio: 2.3 — ABNORMAL HIGH (ref 1.2–2.2)
Albumin: 4.8 g/dL (ref 4.0–5.0)
Alkaline Phosphatase: 46 IU/L (ref 44–121)
BUN/Creatinine Ratio: 16 (ref 9–20)
BUN: 16 mg/dL (ref 6–24)
Bilirubin Total: 0.5 mg/dL (ref 0.0–1.2)
CO2: 24 mmol/L (ref 20–29)
Calcium: 9.5 mg/dL (ref 8.7–10.2)
Chloride: 102 mmol/L (ref 96–106)
Creatinine, Ser: 0.99 mg/dL (ref 0.76–1.27)
Globulin, Total: 2.1 g/dL (ref 1.5–4.5)
Glucose: 94 mg/dL (ref 65–99)
Potassium: 4.3 mmol/L (ref 3.5–5.2)
Sodium: 142 mmol/L (ref 134–144)
Total Protein: 6.9 g/dL (ref 6.0–8.5)
eGFR: 96 mL/min/{1.73_m2} (ref >59)

## 2020-11-05 LAB — PSA: Prostate Specific Ag, Serum: 0.5 ng/mL (ref 0.0–4.0)

## 2020-11-10 ENCOUNTER — Encounter: Payer: Self-pay | Admitting: "Endocrinology

## 2020-11-10 ENCOUNTER — Ambulatory Visit (INDEPENDENT_AMBULATORY_CARE_PROVIDER_SITE_OTHER): Payer: BC Managed Care – PPO | Admitting: "Endocrinology

## 2020-11-10 ENCOUNTER — Other Ambulatory Visit: Payer: Self-pay

## 2020-11-10 VITALS — BP 122/80 | HR 76 | Ht 70.0 in | Wt 231.4 lb

## 2020-11-10 DIAGNOSIS — R7303 Prediabetes: Secondary | ICD-10-CM | POA: Diagnosis not present

## 2020-11-10 DIAGNOSIS — E291 Testicular hypofunction: Secondary | ICD-10-CM

## 2020-11-10 DIAGNOSIS — I10 Essential (primary) hypertension: Secondary | ICD-10-CM

## 2020-11-10 LAB — POCT GLYCOSYLATED HEMOGLOBIN (HGB A1C): HbA1c, POC (prediabetic range): 5.7 % (ref 5.7–6.4)

## 2020-11-10 MED ORDER — TESTOSTERONE CYPIONATE 200 MG/ML IM SOLN: INTRAMUSCULAR | 0 refills | Status: DC

## 2020-11-10 NOTE — Progress Notes (Signed)
11/10/2020      Endocrinology follow-up note   Subjective:    Patient ID: Ian Robinson, male    DOB: [birth year], PCP Pcp, No   Past Medical History:  Diagnosis Date  . Arthritis   . Asthma   . GERD (gastroesophageal reflux disease)   . History of kidney stones   . Kidney stone   . Scoliosis    Past Surgical History:  Procedure Laterality Date  . APPENDECTOMY    . arm fracture surgery    . CARPAL TUNNEL RELEASE     left hand  . ESOPHAGOGASTRODUODENOSCOPY N/A 09/08/2018   Procedure: ESOPHAGOGASTRODUODENOSCOPY (EGD);  Surgeon: Wonda Horner, MD;  Location: [facility];  Service: Endoscopy;  Laterality: N/A;  . FOREIGN BODY REMOVAL N/A 09/08/2018   Procedure: FOREIGN BODY REMOVAL;  Surgeon: Wonda Horner, MD;  Location: [facility];  Service: Endoscopy;  Laterality: N/A;  . FRACTURE SURGERY     left wrist  . KNEE ARTHROSCOPY     right knee  . TOTAL KNEE ARTHROPLASTY Left 03/08/2018   Procedure: LEFT TOTAL KNEE ARTHROPLASTY;  Surgeon: Latanya Maudlin, MD;  Location: [facility];  Service: Orthopedics;  Laterality: Left;  169mn   Social History   Socioeconomic History  . Marital status: Married    Spouse name: Not on file  . Number of children: Not on file  . Years of education: Not on file  . Highest education level: Not on file  Occupational History  . Not on file  Tobacco Use  . Smoking status: Former Smoker    Quit date: 06/08/2007    Years since quitting: 13.4  . Smokeless tobacco: Never Used  Vaping Use  . Vaping Use: Never used  Substance and Sexual Activity  . Alcohol use: Yes    Alcohol/week: 6.0 standard drinks    Types: 6 Cans of beer per week    Comment: occ 2 to 3 times a week  . Drug use: No  . Sexual activity: Not on file  Other Topics Concern  . Not on file  Social History Narrative  . Not on file   Social Determinants of Health   Financial Resource Strain: Not on file  Food Insecurity: Not on file  Transportation Needs: Not on file  Physical  Activity: Not on file  Stress: Not on file  Social Connections: Not on file   Outpatient Encounter Medications as of 11/10/2020  Medication Sig  . aspirin-acetaminophen-caffeine (EXCEDRIN MIGRAINE) 250-250-65 MG tablet Take 2 tablets by mouth every 6 (six) hours as needed for headache.  . busPIRone (BUSPAR) 7.5 MG tablet Take 7.5 mg by mouth 3 (three) times daily as needed.  . cetirizine (ZYRTEC) 10 MG tablet Take 10 mg by mouth daily.  . fluticasone (FLONASE) 50 MCG/ACT nasal spray Place 1 spray into both nostrils daily as needed for allergies or rhinitis.  .Marland Kitchenmontelukast (SINGULAIR) 10 MG tablet Take 10 mg by mouth daily.   .Marland KitchenPROAIR HFA 108 (90 Base) MCG/ACT inhaler Inhale 2 Inhalers into the lungs every 6 (six) hours as needed for wheezing or shortness of breath.  . Syringe/Needle, Disp, (SYRINGE 3CC/20GX1") 20G X 1" 3 ML MISC Use to inject Testosterone 2 times a month  . testosterone cypionate (DEPOTESTOSTERONE CYPIONATE) 200 MG/ML injection Inject  200 mg every 14 days ( total of 4062mmonthly).  . [DISCONTINUED] testosterone cypionate (DEPOTESTOSTERONE CYPIONATE) 200 MG/ML injection Inject  200 mg every 14 days ( total of 40072monthly).  . [DISCONTINUED]  vitamin C (ASCORBIC ACID) 500 MG tablet Take 500 mg by mouth daily. (Patient not taking: Reported on 07/10/2020)   No facility-administered encounter medications on file as of 11/10/2020.   ALLERGIES: Allergies  Allergen Reactions  . Diclofenac     Upset stomach  . Nsaids     Gastric ulcer   . Other     Hay fever  Pistachios and cashews-itchy tongue    . Percocet [Oxycodone-Acetaminophen]     migraines   VACCINATION STATUS:  There is no immunization history on file for this patient.  HPI  Mr. Weida is a 45 year old male patient with medical history as above.     -He is being seen in follow-up for hypogonadism with repeat labs.     He is currently on testosterone 200 mg IM every 14 days.  He feels better, reports compliance.   He has no new complaints today.   His previsit labs show total testosterone of 731 3 days after his last injection.  -He did not father any children, saying that his wife has fertility problems. He was subjected to sperm analysis in the past when he was found to have "low sperm count and motility". He denies testicular injury, chemotherapy, radiation, nor STDs. He reiterated that they are not looking for fertility. His MRI is negative for pituitary/sella mass lesion nor adenoma. he has remote ( at age 53) past face injury with no associated intracranial injury. His prolactin level was also normal, see below.  He has never taken androgens, steroids, nor opiates. he has new c/o frequent sinus infections.   Review of Systems  Limited as above.  Objective:    BP 122/80   Pulse 76   Ht 5' 10"  (1.778 m)   Wt 231 lb 6.4 oz (105 kg)   BMI 33.20 kg/m   Wt Readings from Last 3 Encounters:  11/10/20 231 lb 6.4 oz (105 kg)  07/10/20 227 lb 6.4 oz (103.1 kg)  01/07/20 (!) 231 lb 9.6 oz (105.1 kg)    Physical Exam   Recent Results (from the past 2160 hour(s))  Comprehensive metabolic panel     Status: Abnormal   Collection Time: 11/04/20  8:01 AM  Result Value Ref Range   Glucose 94 65 - 99 mg/dL   BUN 16 6 - 24 mg/dL   Creatinine, Ser 0.99 0.76 - 1.27 mg/dL   eGFR 96 >59 mL/min/1.73   BUN/Creatinine Ratio 16 9 - 20   Sodium 142 134 - 144 mmol/L   Potassium 4.3 3.5 - 5.2 mmol/L   Chloride 102 96 - 106 mmol/L   CO2 24 20 - 29 mmol/L   Calcium 9.5 8.7 - 10.2 mg/dL   Total Protein 6.9 6.0 - 8.5 g/dL   Albumin 4.8 4.0 - 5.0 g/dL   Globulin, Total 2.1 1.5 - 4.5 g/dL   Albumin/Globulin Ratio 2.3 (H) 1.2 - 2.2   Bilirubin Total 0.5 0.0 - 1.2 mg/dL   Alkaline Phosphatase 46 44 - 121 IU/L   AST 21 0 - 40 IU/L   ALT 33 0 - 44 IU/L  CBC with Differential/Platelet     Status: None   Collection Time: 11/04/20  8:01 AM  Result Value Ref Range   WBC 5.2 3.4 - 10.8 x10E3/uL   RBC 5.55 4.14  - 5.80 x10E6/uL   Hemoglobin 16.5 13.0 - 17.7 g/dL   Hematocrit 49.7 37.5 - 51.0 %   MCV 90 79 - 97 fL   MCH 29.7 26.6 -  33.0 pg   MCHC 33.2 31.5 - 35.7 g/dL   RDW 14.0 11.6 - 15.4 %   Platelets 182 150 - 450 x10E3/uL   Neutrophils 60 Not Estab. %   Lymphs 24 Not Estab. %   Monocytes 8 Not Estab. %   Eos 7 Not Estab. %   Basos 1 Not Estab. %   Neutrophils Absolute 3.1 1.4 - 7.0 x10E3/uL   Lymphocytes Absolute 1.3 0.7 - 3.1 x10E3/uL   Monocytes Absolute 0.4 0.1 - 0.9 x10E3/uL   EOS (ABSOLUTE) 0.4 0.0 - 0.4 x10E3/uL   Basophils Absolute 0.1 0.0 - 0.2 x10E3/uL   Immature Granulocytes 0 Not Estab. %   Immature Grans (Abs) 0.0 0.0 - 0.1 x10E3/uL  PSA     Status: None   Collection Time: 11/04/20  8:01 AM  Result Value Ref Range   Prostate Specific Ag, Serum 0.5 0.0 - 4.0 ng/mL    Comment: Roche ECLIA methodology. According to the American Urological Association, Serum PSA should decrease and remain at undetectable levels after radical prostatectomy. The AUA defines biochemical recurrence as an initial PSA value 0.2 ng/mL or greater followed by a subsequent confirmatory PSA value 0.2 ng/mL or greater. Values obtained with different assay methods or kits cannot be used interchangeably. Results cannot be interpreted as absolute evidence of the presence or absence of malignant disease.   Testosterone, Free, Total, SHBG     Status: Abnormal   Collection Time: 11/04/20  8:01 AM  Result Value Ref Range   Testosterone 731 264 - 916 ng/dL    Comment: Adult male reference interval is based on a population of healthy nonobese males (BMI <30) between 22 and 7 years old. [place] [id number]. PMID: 30076226.    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  He wishes to be continued on testosterone replacement therapy.  His previsit labs show total testosterone of 731 3 days after his injection with no evidence of adverse effects from testosterone treatment so far.   -He is advised to continue testosterone cypionate 200 mg IM every 14 days with plan to repeat labs in 4 months. -His CMP, CBC, PSA are favorable so far.  He will have repeat CBC with his next labs.  He will RTN in 4 months with repeat labs. His MRI is negative for sella/pituitary mass lesions. His PRL is normal at 13.3.  2. Prediabetes: -His point-of-care A1c is 5.7%, he is not on any medication intervention for prediabetes.    - he acknowledges that there is a room for improvement in his food and drink choices. - Suggestion is made for him to avoid simple carbohydrates  from his diet including Cakes, Sweet Desserts, Ice Cream, Soda (diet and regular), Sweet Tea, Candies, Chips, Cookies, Store Bought Juices, Alcohol in Excess of  1-2 drinks a day, Artificial Sweeteners,  Coffee Creamer, and "Sugar-free" Products, Lemonade. This will help patient to have more stable blood glucose profile and potentially avoid  unintended weight gain.    3. Hypertension:  -His blood pressure is controlled to target.  He has no blood pressure medication at this time.  This is mainly due to recent significant weight loss. See above   - I advised patient to maintain close follow up with his PCP for primary care needs.   He is advised to maintain close follow-up with his PCP for primary care needs.    I spent 25 minutes in the care of the patient today including review of labs from Thyroid Function, CMP, and other relevant labs ; imaging/biopsy records (current and  previous including abstractions from other facilities); face-to-face time discussing  his lab results and symptoms, medications doses, his options of short and long term treatment based on the latest standards of care / guidelines;   and documenting the encounter.  [name]  participated in the discussions, expressed understanding, and voiced agreement with the above plans.  All questions were answered to his satisfaction. he is encouraged to contact clinic should he have any questions or concerns prior to his return visit.   Follow up plan: Return in about 4 months (around 03/12/2021) for Fasting Labs  in AM B4 8.  Glade Lloyd, MD Phone: [phone]  Fax: [phone]   -  This note was partially dictated with voice recognition software. Similar sounding words can be transcribed inadequately or may not  be corrected upon review.  11/10/2020, 10:25 AM

## 2021-01-23 ENCOUNTER — Other Ambulatory Visit: Payer: Self-pay | Admitting: "Endocrinology

## 2021-03-16 ENCOUNTER — Ambulatory Visit: Payer: BC Managed Care – PPO | Admitting: "Endocrinology

## 2021-03-17 LAB — CBC WITH DIFFERENTIAL/PLATELET
Basophils Absolute: 0.1 10*3/uL (ref 0.0–0.2)
Basos: 1 %
EOS (ABSOLUTE): 0.4 10*3/uL (ref 0.0–0.4)
Eos: 7 %
Hematocrit: 43.2 % (ref 37.5–51.0)
Hemoglobin: 15.3 g/dL (ref 13.0–17.7)
Immature Grans (Abs): 0 10*3/uL (ref 0.0–0.1)
Immature Granulocytes: 0 %
Lymphocytes Absolute: 1.4 10*3/uL (ref 0.7–3.1)
Lymphs: 27 %
MCH: 30.8 pg (ref 26.6–33.0)
MCHC: 35.4 g/dL (ref 31.5–35.7)
MCV: 87 fL (ref 79–97)
Monocytes Absolute: 0.4 10*3/uL (ref 0.1–0.9)
Monocytes: 8 %
Neutrophils Absolute: 2.9 10*3/uL (ref 1.4–7.0)
Neutrophils: 57 %
Platelets: 182 10*3/uL (ref 150–450)
RBC: 4.97 x10E6/uL (ref 4.14–5.80)
RDW: 13.1 % (ref 11.6–15.4)
WBC: 5.2 10*3/uL (ref 3.4–10.8)

## 2021-03-17 LAB — TESTOSTERONE, FREE, TOTAL, SHBG
Sex Hormone Binding: 17.6 nmol/L (ref 16.5–55.9)
Testosterone, Free: 16.6 pg/mL (ref 6.8–21.5)
Testosterone: 578 ng/dL (ref 264–916)

## 2021-03-23 ENCOUNTER — Telehealth: Payer: Self-pay

## 2021-03-23 NOTE — Telephone Encounter (Signed)
Started PA for testosterone cypionate 200mg /ml.

## 2021-04-06 NOTE — Telephone Encounter (Signed)
Per Nurse, pt needs an appointment before we can appeal a PA.

## 2021-04-28 ENCOUNTER — Encounter: Payer: Self-pay | Admitting: "Endocrinology

## 2021-04-28 ENCOUNTER — Ambulatory Visit (INDEPENDENT_AMBULATORY_CARE_PROVIDER_SITE_OTHER): Payer: BC Managed Care – PPO | Admitting: "Endocrinology

## 2021-04-28 VITALS — BP 128/80 | HR 88 | Ht 70.0 in | Wt 230.0 lb

## 2021-04-28 DIAGNOSIS — E291 Testicular hypofunction: Secondary | ICD-10-CM | POA: Diagnosis not present

## 2021-04-28 DIAGNOSIS — R7303 Prediabetes: Secondary | ICD-10-CM | POA: Diagnosis not present

## 2021-04-28 DIAGNOSIS — I10 Essential (primary) hypertension: Secondary | ICD-10-CM

## 2021-04-28 MED ORDER — TESTOSTERONE CYPIONATE 200 MG/ML IM SOLN: INTRAMUSCULAR | 0 refills | Status: DC

## 2021-04-28 NOTE — Progress Notes (Signed)
04/28/2021      Endocrinology follow-up note   Subjective:    Patient ID: Ian Robinson, male    DOB: Nov 03, 1975, PCP Pcp, No   Past Medical History:  Diagnosis Date   Arthritis    Asthma    GERD (gastroesophageal reflux disease)    History of kidney stones    Kidney stone    Scoliosis    Past Surgical History:  Procedure Laterality Date   APPENDECTOMY     arm fracture surgery     CARPAL TUNNEL RELEASE     left hand   ESOPHAGOGASTRODUODENOSCOPY N/A 09/08/2018   Procedure: ESOPHAGOGASTRODUODENOSCOPY (EGD);  Surgeon: Wonda Horner, MD;  Location: [facility];  Service: Endoscopy;  Laterality: N/A;   FOREIGN BODY REMOVAL N/A 09/08/2018   Procedure: FOREIGN BODY REMOVAL;  Surgeon: Wonda Horner, MD;  Location: [facility];  Service: Endoscopy;  Laterality: N/A;   FRACTURE SURGERY     left wrist   KNEE ARTHROSCOPY     right knee   TOTAL KNEE ARTHROPLASTY Left 03/08/2018   Procedure: LEFT TOTAL KNEE ARTHROPLASTY;  Surgeon: Latanya Maudlin, MD;  Location: [facility];  Service: Orthopedics;  Laterality: Left;  147min   Social History   Socioeconomic History   Marital status: Married    Spouse name: Not on file   Number of children: Not on file   Years of education: Not on file   Highest education level: Not on file  Occupational History   Not on file  Tobacco Use   Smoking status: Former    Types: Cigarettes    Quit date: 06/08/2007    Years since quitting: 13.8   Smokeless tobacco: Never  Vaping Use   Vaping Use: Never used  Substance and Sexual Activity   Alcohol use: Yes    Alcohol/week: 6.0 standard drinks    Types: 6 Cans of beer per week    Comment: occ 2 to 3 times a week   Drug use: No   Sexual activity: Not on file  Other Topics Concern   Not on file  Social History Narrative   Not on file   Social Determinants of Health   Financial Resource Strain: Not on file  Food Insecurity: Not on file  Transportation Needs: Not on file  Physical Activity: Not  on file  Stress: Not on file  Social Connections: Not on file   Outpatient Encounter Medications as of 04/28/2021  Medication Sig   aspirin-acetaminophen-caffeine (EXCEDRIN MIGRAINE) 250-250-65 MG tablet Take 2 tablets by mouth every 6 (six) hours as needed for headache.   busPIRone (BUSPAR) 7.5 MG tablet Take 7.5 mg by mouth 3 (three) times daily as needed.   cetirizine (ZYRTEC) 10 MG tablet Take 10 mg by mouth daily.   fluticasone (FLONASE) 50 MCG/ACT nasal spray Place 1 spray into both nostrils daily as needed for allergies or rhinitis.   montelukast (SINGULAIR) 10 MG tablet Take 10 mg by mouth daily.    PROAIR HFA 108 (90 Base) MCG/ACT inhaler Inhale 2 Inhalers into the lungs every 6 (six) hours as needed for wheezing or shortness of breath.   SYRINGE-NEEDLE, DISP, 3 ML (B-D 3CC LUER-LOK SYR 23GX1") 23G X 1" 3 ML MISC USE TO INJECT TESTOSTERONE   Syringe/Needle, Disp, (SYRINGE 3CC/20GX1") 20G X 1" 3 ML MISC Use to inject Testosterone 2 times a month   testosterone cypionate (DEPOTESTOSTERONE CYPIONATE) 200 MG/ML injection INJECT ONE ML EVERY 14 DAYS AS DIRECTED   [DISCONTINUED] testosterone cypionate (  DEPOTESTOSTERONE CYPIONATE) 200 MG/ML injection INJECT ONE ML EVERY 14 DAYS AS DIRECTED   No facility-administered encounter medications on file as of 04/28/2021.   ALLERGIES: Allergies  Allergen Reactions   Diclofenac     Upset stomach   Nsaids     Gastric ulcer    Other     Hay fever  Pistachios and cashews-itchy tongue     Percocet [Oxycodone-Acetaminophen]     migraines   VACCINATION STATUS:  There is no immunization history on file for this patient.  HPI  Ian Robinson is a 45 year old male patient with medical history as above.     -He is being seen in follow-up for hypogonadism with repeat labs.     He is currently on testosterone 200 mg IM every 14 days.  He feels better, reports compliance.  He has no new complaints today.   His previsit labs show total testosterone  of 578, 7 days after his last injection.   Has no new complaints today.  -He did not father any children, saying that his wife has fertility problems. He was subjected to sperm analysis in the past when he was found to have "low sperm count and motility". He denies testicular injury, chemotherapy, radiation, nor STDs. He reiterated that they are not looking for fertility. His MRI is negative for pituitary/sella mass lesion nor adenoma. he has remote ( at age 84) past face injury with no associated intracranial injury. His prolactin level was also normal, see below.  He has never taken androgens, steroids, nor opiates. he has new c/o frequent sinus infections.   Review of Systems  Limited as above.  Objective:    BP 128/80   Pulse 88   Ht 5\' 10"  (1.778 m)   Wt 230 lb (104.3 kg)   BMI 33.00 kg/m   Wt Readings from Last 3 Encounters:  04/28/21 230 lb (104.3 kg)  11/10/20 231 lb 6.4 oz (105 kg)  07/10/20 227 lb 6.4 oz (103.1 kg)    Physical Exam   Recent Results (from the past 2160 hour(s))  Testosterone, Free, Total, SHBG     Status: None   Collection Time: 03/16/21  8:15 AM  Result Value Ref Range   Testosterone 578 264 - 916 ng/dL    Comment: Adult male reference interval is based on a population of healthy nonobese males (BMI <30) between 5 and 69 years old. [place], et.al. JCEM [phone]. PMID: 2353,614;4315-4008.    Testosterone, Free 16.6 6.8 - 21.5 pg/mL   Sex Hormone Binding 17.6 16.5 - 55.9 nmol/L  CBC with Differential/Platelet     Status: None   Collection Time: 03/16/21  8:15 AM  Result Value Ref Range   WBC 5.2 3.4 - 10.8 x10E3/uL   RBC 4.97 4.14 - 5.80 x10E6/uL   Hemoglobin 15.3 13.0 - 17.7 g/dL   Hematocrit 05/16/21 32.6 - 51.0 %   MCV 87 79 - 97 fL   MCH 30.8 26.6 - 33.0 pg   MCHC 35.4 31.5 - 35.7 g/dL   RDW 71.2 45.8 - 09.9 %   Platelets 182 150 - 450 x10E3/uL   Neutrophils 57 Not Estab. %   Lymphs 27 Not Estab. %   Monocytes 8 Not Estab. %   Eos 7  Not Estab. %   Basos 1 Not Estab. %   Neutrophils Absolute 2.9 1.4 - 7.0 x10E3/uL   Lymphocytes Absolute 1.4 0.7 - 3.1 x10E3/uL   Monocytes Absolute 0.4 0.1 - 0.9 x10E3/uL   EOS (ABSOLUTE)  0.4 0.0 - 0.4 x10E3/uL   Basophils Absolute 0.1 0.0 - 0.2 x10E3/uL   Immature Granulocytes 0 Not Estab. 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  He wishes to be continued on testosterone replacement therapy.  His previsit labs show total testosterone of 578 7 days after his last injection with no evidence of adverse effects from testosterone treatment so far.   -He is advised to continue testosterone cypionate 200 mg IM every 14 days with plan to repeat labs in 4 months. -His CMP, CBC, PSA are favorable so far.  He will have repeat CBC with his next labs.  He will RTN in 4 months with repeat labs. His MRI is negative for sella/pituitary mass lesions. His PRL is normal at 13.3.  2. Prediabetes: -His point-of-care A1c is 5.7%, he is not on any medication intervention for prediabetes.    - he acknowledges that there is a room for improvement in his food and drink choices. - Suggestion is made for him to avoid simple carbohydrates  from his diet including Cakes, Sweet Desserts, Ice Cream, Soda (diet and regular), Sweet Tea, Candies, Chips, Cookies, Store Bought Juices, Alcohol in Excess of  1-2 drinks a day, Artificial Sweeteners,  Coffee Creamer, and "Sugar-free" Products, Lemonade. This will help patient to have more stable blood glucose profile and potentially avoid unintended weight gain.    3. Hypertension:  -His blood pressure is controlled to target.    He has no blood pressure medication at this time.  This is mainly due to recent significant weight loss. See above   - I advised patient to maintain close follow up with his PCP for  primary care needs.   He is advised to maintain close follow-up with his PCP for primary care needs.    I spent 21 minutes in the care of the patient today including review of labs from Thyroid Function, CMP, and other relevant labs ; imaging/biopsy records (current and previous including abstractions from other facilities); face-to-face time discussing  his lab results and symptoms, medications doses, his options of short and long term treatment based on the latest standards of care / guidelines;   and documenting the encounter.  [name]  participated in the discussions, expressed understanding, and voiced agreement with the above plans.  All questions were answered to his satisfaction. he is encouraged to contact clinic should he have any questions or concerns prior to his return visit.  Follow up plan: Return in about 4 months (around 08/26/2021) for F/U with Pre-visit Labs.  Glade Lloyd, MD Phone: [phone]  Fax: [phone]   -  This note was partially dictated with voice recognition software. Similar sounding words can be transcribed inadequately or may not  be corrected upon review.  04/28/2021, 5:28 PM

## 2021-05-15 ENCOUNTER — Telehealth: Payer: Self-pay

## 2021-05-15 NOTE — Telephone Encounter (Signed)
Started and sent PA for testosterone.

## 2021-05-18 ENCOUNTER — Telehealth: Payer: Self-pay | Admitting: "Endocrinology

## 2021-05-18 NOTE — Telephone Encounter (Signed)
Pt states prior authorization goes through [employer]. Advised pt I will call [facility] tomorrow to start PA, understanding voiced.

## 2021-05-18 NOTE — Telephone Encounter (Signed)
Pt states he needs a PA on his Testosterone. Have you seen this come over from the pharmacy?

## 2021-05-19 ENCOUNTER — Telehealth: Payer: Self-pay

## 2021-05-19 NOTE — Telephone Encounter (Signed)
Notified pt that we submitted PA for his Testosterone to [facility]. We were given a 72 hr timeline for response from insurance.

## 2021-06-17 ENCOUNTER — Telehealth: Payer: Self-pay | Admitting: "Endocrinology

## 2021-06-17 NOTE — Telephone Encounter (Signed)
Pt has new insurance this year and states his insurance is going to require another PA for his medication with the new insurance.   SYRINGE-NEEDLE, DISP, 3 ML (B-D 3CC LUER-LOK SYR 23GX1") 23G X 1" 3 ML MISC  testosterone cypionate (DEPOTESTOSTERONE CYPIONATE) 200 MG/ML

## 2021-06-17 NOTE — Telephone Encounter (Signed)
Will send in information for PA through new insurance.

## 2021-06-18 ENCOUNTER — Telehealth: Payer: Self-pay

## 2021-06-18 NOTE — Telephone Encounter (Signed)
Sent PA request for testosterone cypionate to pt's new insurance [employer] through [employer].

## 2021-08-31 ENCOUNTER — Ambulatory Visit: Payer: BC Managed Care – PPO | Admitting: "Endocrinology

## 2021-08-31 ENCOUNTER — Other Ambulatory Visit: Payer: Self-pay

## 2021-08-31 ENCOUNTER — Encounter: Payer: Self-pay | Admitting: "Endocrinology

## 2021-08-31 VITALS — BP 122/80 | HR 76 | Ht 70.0 in | Wt 234.8 lb

## 2021-08-31 DIAGNOSIS — E291 Testicular hypofunction: Secondary | ICD-10-CM | POA: Diagnosis not present

## 2021-08-31 DIAGNOSIS — R7303 Prediabetes: Secondary | ICD-10-CM | POA: Diagnosis not present

## 2021-08-31 DIAGNOSIS — I10 Essential (primary) hypertension: Secondary | ICD-10-CM | POA: Diagnosis not present

## 2021-08-31 LAB — TESTOSTERONE, FREE, TOTAL, SHBG
Sex Hormone Binding: 18.9 nmol/L (ref 16.5–55.9)
Testosterone, Free: 20.9 pg/mL (ref 6.8–21.5)
Testosterone: 830 ng/dL (ref 264–916)

## 2021-08-31 MED ORDER — TESTOSTERONE CYPIONATE 200 MG/ML IM SOLN: INTRAMUSCULAR | 0 refills | Status: DC

## 2021-08-31 NOTE — Progress Notes (Signed)
? ?08/31/2021 ? ? Endocrinology follow-up note ? ? ?Subjective:  ? ? Patient ID: Ian Robinson, male    DOB: Dec 28, 1975, PCP Pcp, No ? ? ?Past Medical History:  ?Diagnosis Date  ? Arthritis   ? Asthma   ? GERD (gastroesophageal reflux disease)   ? History of kidney stones   ? Kidney stone   ? Scoliosis   ? ?Past Surgical History:  ?Procedure Laterality Date  ? APPENDECTOMY    ? arm fracture surgery    ? CARPAL TUNNEL RELEASE    ? left hand  ? ESOPHAGOGASTRODUODENOSCOPY N/A 09/08/2018  ? Procedure: ESOPHAGOGASTRODUODENOSCOPY (EGD);  Surgeon: Graylin ShiverGanem, Salem F, MD;  [facility];  Service: Endoscopy;  Laterality: N/A;  ? FOREIGN BODY REMOVAL N/A 09/08/2018  ? Procedure: FOREIGN BODY REMOVAL;  Surgeon: Graylin ShiverGanem, Salem F, MD;  Location: [facility];  Service: Endoscopy;  Laterality: N/A;  ? FRACTURE SURGERY    ? left wrist  ? KNEE ARTHROSCOPY    ? right knee  ? TOTAL KNEE ARTHROPLASTY Left 03/08/2018  ? Procedure: LEFT TOTAL KNEE ARTHROPLASTY;  Surgeon: Ranee GosselinGioffre, Ronald, MD;  Location: [facility];  Service: Orthopedics;  Laterality: Left;  150min  ? ?Social History  ? ?Socioeconomic History  ? Marital status: Married  ?  Spouse name: Not on file  ? Number of children: Not on file  ? Years of education: Not on file  ? Highest education level: Not on file  ?Occupational History  ? Not on file  ?Tobacco Use  ? Smoking status: Former  ?  Types: Cigarettes  ?  Quit date: 06/08/2007  ?  Years since quitting: 14.2  ? Smokeless tobacco: Never  ?Vaping Use  ? Vaping Use: Never used  ?Substance and Sexual Activity  ? Alcohol use: Yes  ?  Alcohol/week: 6.0 standard drinks  ?  Types: 6 Cans of beer per week  ?  Comment: occ 2 to 3 times a week  ? Drug use: No  ? Sexual activity: Not on file  ?Other Topics Concern  ? Not on file  ?Social History Narrative  ? Not on file  ? ?Social Determinants of Health  ? ?Financial Resource Strain: Not on file  ?Food Insecurity: Not on file  ?Transportation Needs: Not on file  ?Physical Activity: Not on  file  ?Stress: Not on file  ?Social Connections: Not on file  ? ?Outpatient Encounter Medications as of 08/31/2021  ?Medication Sig  ? aspirin-acetaminophen-caffeine (EXCEDRIN MIGRAINE) 250-250-65 MG tablet Take 2 tablets by mouth every 6 (six) hours as needed for headache.  ? busPIRone (BUSPAR) 7.5 MG tablet Take 7.5 mg by mouth 3 (three) times daily as needed.  ? cetirizine (ZYRTEC) 10 MG tablet Take 10 mg by mouth daily.  ? fluticasone (FLONASE) 50 MCG/ACT nasal spray Place 1 spray into both nostrils daily as needed for allergies or rhinitis.  ? montelukast (SINGULAIR) 10 MG tablet Take 10 mg by mouth daily.   ? PROAIR HFA 108 (90 Base) MCG/ACT inhaler Inhale 2 Inhalers into the lungs every 6 (six) hours as needed for wheezing or shortness of breath.  ? SYRINGE-NEEDLE, DISP, 3 ML (B-D 3CC LUER-LOK SYR 23GX1") 23G X 1" 3 ML MISC USE TO INJECT TESTOSTERONE  ? Syringe/Needle, Disp, (SYRINGE 3CC/20GX1") 20G X 1" 3 ML MISC Use to inject Testosterone 2 times a month  ? testosterone cypionate (DEPOTESTOSTERONE CYPIONATE) 200 MG/ML injection INJECT ONE ML EVERY 14 DAYS AS DIRECTED  ? [DISCONTINUED] testosterone cypionate (DEPOTESTOSTERONE CYPIONATE) 200  MG/ML injection INJECT ONE ML EVERY 14 DAYS AS DIRECTED  ? ?No facility-administered encounter medications on file as of 08/31/2021.  ? ?ALLERGIES: ?Allergies  ?Allergen Reactions  ? Diclofenac   ?  Upset stomach  ? Nsaids   ?  Gastric ulcer   ? Other   ?  Hay fever  ?Pistachios and cashews-itchy tongue    ? Percocet [Oxycodone-Acetaminophen]   ?  migraines  ? ?VACCINATION STATUS: ? ?There is no immunization history on file for this patient. ? ?HPI ? ?Ian Robinson is a 46 year old male patient with medical history as above.    ?-He is being seen in follow-up for hypogonadism with repeat labs.   ? ? He is currently on testosterone 200 mg IM every 14 days.  He feels better, reports compliance.  He has no new complaints today.  His previsit labs showed total testosterone stable  at 830.   ?-He did not father any children, saying that his wife has fertility problems. He was subjected to sperm analysis in the past when he was found to have "low sperm count and motility". ?He denies testicular injury, chemotherapy, radiation, nor STDs. ?He reiterated that they are not looking for fertility. ?His MRI is negative for pituitary/sella mass lesion nor adenoma. ?he has remote ( at age 18) past face injury with no associated intracranial injury. His prolactin level was also normal, see below.  ?He has never taken androgens, steroids, nor opiates. he has new c/o frequent sinus infections.  ? ?Review of Systems ? ?Limited as above. ? ?Objective:  ?  ?BP 122/80   Pulse 76   Ht 5\' 10"  (1.778 m)   Wt 234 lb 12.8 oz (106.5 kg)   BMI 33.69 kg/m?   ?Wt Readings from Last 3 Encounters:  ?08/31/21 234 lb 12.8 oz (106.5 kg)  ?04/28/21 230 lb (104.3 kg)  ?11/10/20 231 lb 6.4 oz (105 kg)  ?  ?Physical Exam ? ? ?Recent Results (from the past 2160 hour(s))  ?Testosterone, Free, Total, SHBG     Status: None  ? Collection Time: 08/26/21 10:17 AM  ?Result Value Ref Range  ? Testosterone 830 264 - 916 ng/dL  ?  Comment: Adult male reference interval is based on a population of ?healthy nonobese males (BMI <30) between 72 and 31 years old. ?Travison, et.al. JCEM 2017,102;1161-1173. PMID: 24. ?  ? Testosterone, Free 20.9 6.8 - 21.5 pg/mL  ? Sex Hormone Binding 18.9 16.5 - 55.9 nmol/L  ? ? ?Assessment & Plan:  ? 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  He wishes to be continued on testosterone replacement therapy.  His previsit labs show total testosterone of 578 7 days after his last injection with no evidence of adverse effects from testosterone treatment so far.   ?-He is advised to continue testosterone cypionate 200 mg IM every 14 days with plan to repeat labs in 4 months. ?-His CMP, CBC, PSA are favorable so far.   He will have repeat CBC with his next labs. ? ?He will RTN in 4 months with repeat labs. His MRI is negative for sella/pituitary mass lesions. His PRL is normal at 13.3. ? ?2. Prediabetes: ?-His point-of-care A1c is 5.7%, he is not on any medication intervention for prediabetes.   ? ?- he acknowledges that there is a room for improvement in his food and drink choices. ?- Suggestion is made for him to avoid simple carbohydrates  from his diet including Cakes,  Sweet Desserts, Ice Cream, Soda (diet and regular), Sweet Tea, Candies, Chips, Cookies, Store Bought Juices, Alcohol in Excess of  1-2 drinks a day, Artificial Sweeteners,  Coffee Creamer, and "Sugar-free" Products, Lemonade. This will help patient to have more stable blood glucose profile and potentially avoid unintended weight gain. ? ? ? ?3. Hypertension:  ?- her BP is controled to target.    He has no blood pressure medication at this time.  This is mainly due to recent significant weight loss. See above  ? ?- I advised patient to maintain close follow up with his PCP for primary care needs. ? ? ?He is advised to maintain close follow-up with his PCP for primary care needs. ? ? ?I spent 31 minutes in the care of the patient today including review of labs from Thyroid Function, CMP, and other relevant labs ; imaging/biopsy records (current and previous including abstractions from other facilities); face-to-face time discussing  his lab results and symptoms, medications doses, his options of short and long term treatment based on the latest standards of care / guidelines;   and documenting the encounter. ? ?LEDGER HEINDL  participated in the discussions, expressed understanding, and voiced agreement with the above plans.  All questions were answered to his satisfaction. he is encouraged to contact clinic should he have any questions or concerns prior to his return visit. ? ? ?Follow up plan: ?Return in about 4 months (around 12/31/2021) for Fasting Labs  in AM  B4 8, A1c -[place]. ? ?Marquis Lunch, MD ?Phone: [phone]  Fax: [phone]  ? ?-  This note was partially dictated with voice recognition software. Similar sounding words can be transcribed inadequately or may n

## 2021-12-05 HISTORY — PX: CYST REMOVAL HAND: SHX6279

## 2022-01-11 ENCOUNTER — Ambulatory Visit: Payer: BC Managed Care – PPO | Admitting: "Endocrinology

## 2022-01-14 LAB — TESTOSTERONE, FREE, TOTAL, SHBG
Sex Hormone Binding: 19 nmol/L (ref 16.5–55.9)
Testosterone, Free: 6.6 pg/mL — ABNORMAL LOW (ref 6.8–21.5)
Testosterone: 269 ng/dL (ref 264–916)

## 2022-01-14 LAB — PSA: Prostate Specific Ag, Serum: 0.4 ng/mL (ref 0.0–4.0)

## 2022-01-28 ENCOUNTER — Encounter: Payer: Self-pay | Admitting: "Endocrinology

## 2022-01-28 ENCOUNTER — Ambulatory Visit: Payer: BC Managed Care – PPO | Admitting: "Endocrinology

## 2022-01-28 VITALS — BP 104/68 | HR 84 | Ht 70.0 in | Wt 230.8 lb

## 2022-01-28 DIAGNOSIS — I10 Essential (primary) hypertension: Secondary | ICD-10-CM

## 2022-01-28 DIAGNOSIS — R7303 Prediabetes: Secondary | ICD-10-CM

## 2022-01-28 DIAGNOSIS — E291 Testicular hypofunction: Secondary | ICD-10-CM

## 2022-01-28 MED ORDER — TESTOSTERONE CYPIONATE 200 MG/ML IM SOLN: INTRAMUSCULAR | 0 refills | Status: DC

## 2022-01-28 NOTE — Progress Notes (Signed)
01/28/2022   Endocrinology follow-up note   Subjective:    Patient ID: Ian Robinson, male    DOB: 1976-03-19, PCP Pcp, No   Past Medical History:  Diagnosis Date   Arthritis    Asthma    GERD (gastroesophageal reflux disease)    History of kidney stones    Kidney stone    Scoliosis    Past Surgical History:  Procedure Laterality Date   APPENDECTOMY     arm fracture surgery     CARPAL TUNNEL RELEASE     left hand   CYST REMOVAL HAND  12/2021   ESOPHAGOGASTRODUODENOSCOPY N/A 09/08/2018   Procedure: ESOPHAGOGASTRODUODENOSCOPY (EGD);  Surgeon: Graylin Shiver, MD;  Location: [facility];  Service: Endoscopy;  Laterality: N/A;   FOREIGN BODY REMOVAL N/A 09/08/2018   Procedure: FOREIGN BODY REMOVAL;  Surgeon: Graylin Shiver, MD;  Location: [facility];  Service: Endoscopy;  Laterality: N/A;   FRACTURE SURGERY     left wrist   KNEE ARTHROSCOPY     right knee   TOTAL KNEE ARTHROPLASTY Left 03/08/2018   Procedure: LEFT TOTAL KNEE ARTHROPLASTY;  Surgeon: Ranee Gosselin, MD;  Location: [facility];  Service: Orthopedics;  Laterality: Left;    Social History   Socioeconomic History   Marital status: Married    Spouse name: Not on file   Number of children: Not on file   Years of education: Not on file   Highest education level: Not on file  Occupational History   Not on file  Tobacco Use   Smoking status: Former    Types: Cigarettes    Quit date: 06/08/2007    Years since quitting: 14.6   Smokeless tobacco: Never  Vaping Use   Vaping Use: Never used  Substance and Sexual Activity   Alcohol use: Yes    Alcohol/week: 6.0 standard drinks of alcohol    Types: 6 Cans of beer per week    Comment: occ 2 to 3 times a week   Drug use: No   Sexual activity: Not on file  Other Topics Concern   Not on file  Social History Narrative   Not on file   Social Determinants of Health   Financial Resource Strain: Not on file  Food Insecurity: Not on file  Transportation  Needs: Not on file  Physical Activity: Not on file  Stress: Not on file  Social Connections: Not on file   Outpatient Encounter Medications as of 01/28/2022  Medication Sig   aspirin-acetaminophen-caffeine (EXCEDRIN MIGRAINE) 250-250-65 MG tablet Take 2 tablets by mouth every 6 (six) hours as needed for headache.   busPIRone (BUSPAR) 7.5 MG tablet Take 7.5 mg by mouth 3 (three) times daily as needed.   cetirizine (ZYRTEC) 10 MG tablet Take 10 mg by mouth daily.   fluticasone (FLONASE) 50 MCG/ACT nasal spray Place 1 spray into both nostrils daily as needed for allergies or rhinitis.   montelukast (SINGULAIR) 10 MG tablet Take 10 mg by mouth daily.    PROAIR HFA 108 (90 Base) MCG/ACT inhaler Inhale 2 Inhalers into the lungs every 6 (six) hours as needed for wheezing or shortness of breath.   SYRINGE-NEEDLE, DISP, 3 ML (B-D 3CC LUER-LOK SYR 23GX1") 23G X 1" 3 ML MISC USE TO INJECT TESTOSTERONE   Syringe/Needle, Disp, (SYRINGE 3CC/20GX1") 20G X 1" 3 ML MISC Use to inject Testosterone 2 times a month   testosterone cypionate (DEPOTESTOSTERONE CYPIONATE) 200 MG/ML injection INJECT ONE ML EVERY 14 DAYS AS  DIRECTED   [DISCONTINUED] testosterone cypionate (DEPOTESTOSTERONE CYPIONATE) 200 MG/ML injection INJECT ONE ML EVERY 14 DAYS AS DIRECTED   No facility-administered encounter medications on file as of 01/28/2022.   ALLERGIES: Allergies  Allergen Reactions   Diclofenac     Upset stomach   Nsaids     Gastric ulcer    Other     Hay fever  Pistachios and cashews-itchy tongue     Percocet [Oxycodone-Acetaminophen]     migraines   VACCINATION STATUS:  There is no immunization history on file for this patient.  HPI  Ian Robinson is a 46 year old male patient with medical history as above.    -Ian Robinson is being seen in follow-up for hypogonadism with repeat labs.     Ian Robinson is currently on testosterone 200 mg IM every 14 days.  Ian Robinson feels better, reports compliance.  Ian Robinson has no new complaints today.  His  previsit labs show total testosterone 269 1 day before his next injection on January 07, 2022.  -Ian Robinson did not father any children, saying that his wife has fertility problems. Ian Robinson was subjected to sperm analysis in the past when Ian Robinson was found to have "low sperm count and motility". Ian Robinson denies testicular injury, chemotherapy, radiation, nor STDs. Ian Robinson reiterated that they are not looking for fertility. His MRI is negative for pituitary/sella mass lesion nor adenoma. Ian Robinson has remote ( at age 18) past face injury with no associated intracranial injury. His prolactin level was also normal, see below.  Ian Robinson has never taken androgens, steroids, nor opiates. Ian Robinson has new c/o frequent sinus infections.   Review of Systems  Limited as above.  Objective:    BP 104/68   Pulse 84   Ht 5\' 10"  (1.778 m)   Wt 230 lb 12.8 oz (104.7 kg)   BMI 33.12 kg/m   Wt Readings from Last 3 Encounters:  01/28/22 230 lb 12.8 oz (104.7 kg)  08/31/21 234 lb 12.8 oz (106.5 kg)  04/28/21 230 lb (104.3 kg)    Physical Exam   Recent Results (from the past 2160 hour(s))  PSA     Status: None   Collection Time: 01/07/22 10:15 AM  Result Value Ref Range   Prostate Specific Ag, Serum 0.4 0.0 - 4.0 ng/mL    Comment: Roche ECLIA methodology. According to the American Urological Association, Serum PSA should decrease and remain at undetectable levels after radical prostatectomy. The AUA defines biochemical recurrence as an initial PSA value 0.2 ng/mL or greater followed by a subsequent confirmatory PSA value 0.2 ng/mL or greater. Values obtained with different assay methods or kits cannot be used interchangeably. Results cannot be interpreted as absolute evidence of the presence or absence of malignant disease.   Testosterone, Free, Total, SHBG     Status: Abnormal   Collection Time: 01/07/22 10:15 AM  Result Value Ref Range   Testosterone 269 264 - 916 ng/dL    Comment: Adult male reference interval is based on a population  of healthy nonobese males (BMI <30) between 78 and 63 years old. Travison, et.al. JCEM 343 115 2915. PMID: 1438,887;5797-2820.    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  Ian Robinson wishes to be continued on testosterone replacement therapy.  His previsit labs show total testosterone of 269 1 day before his next injection.  This  is acceptable level for him and does not need any increased in his doses.  No adverse effects from testosterone so far. -Ian Robinson is advised to continue testosterone cypionate 200 mg IM every 14 days with plan to repeat labs in 4 months. -His CMP, CBC, PSA are favorable so far.  Ian Robinson will have CMP, PSA along with his next testosterone measurements before his next visit informed    His MRI is negative for sella/pituitary mass lesions. His PRL is normal at 13.3.  2. Prediabetes: -His point-of-care A1c is 5.7%, Ian Robinson is not on any medication intervention for prediabetes.     - Ian Robinson acknowledges that there is a room for improvement in his food and drink choices. - Suggestion is made for him to avoid simple carbohydrates  from his diet including Cakes, Sweet Desserts, Ice Cream, Soda (diet and regular), Sweet Tea, Candies, Chips, Cookies, Store Bought Juices, Alcohol in Excess of  1-2 drinks a day, Artificial Sweeteners,  Coffee Creamer, and "Sugar-free" Products, Lemonade. This will help patient to have more stable blood glucose profile and potentially avoid unintended weight gain.  3. Hypertension:  -His blood pressure is controlled to target.   Ian Robinson has no blood pressure medication at this time.  This is mainly due to recent significant weight loss. See above   - I advised patient to maintain close follow up with his PCP for primary care needs.   Ian Robinson is advised to maintain close follow-up  with his PCP for primary care needs.   I spent 30 minutes in the care of the patient today including review of labs from Thyroid Function, CMP, and other relevant labs ; imaging/biopsy records (current and previous including abstractions from other facilities); face-to-face time discussing  his lab results and symptoms, medications doses, his options of short and long term treatment based on the latest standards of care / guidelines;   and documenting the encounter.  Gara Kroner  participated in the discussions, expressed understanding, and voiced agreement with the above plans.  All questions were answered to his satisfaction. Ian Robinson is encouraged to contact clinic should Ian Robinson have any questions or concerns prior to his return visit.    Follow up plan: Return in about 4 months (around 05/30/2022) for Fasting Labs  in AM B4 8.  Marquis Lunch, MD Phone: [phone]  Fax: [phone]   -  This note was partially dictated with voice recognition software. Similar sounding words can be transcribed inadequately or may not  be corrected upon review.  01/28/2022, 1:36 PM

## 2022-02-16 ENCOUNTER — Other Ambulatory Visit: Payer: Self-pay | Admitting: "Endocrinology

## 2022-06-10 ENCOUNTER — Ambulatory Visit: Payer: BC Managed Care – PPO | Admitting: "Endocrinology

## 2022-06-17 LAB — COMPREHENSIVE METABOLIC PANEL
ALT: 27 IU/L (ref 0–44)
AST: 23 IU/L (ref 0–40)
Albumin/Globulin Ratio: 2 (ref 1.2–2.2)
Albumin: 4.7 g/dL (ref 4.1–5.1)
Alkaline Phosphatase: 49 IU/L (ref 44–121)
BUN/Creatinine Ratio: 23 — ABNORMAL HIGH (ref 9–20)
BUN: 23 mg/dL (ref 6–24)
Bilirubin Total: 1.1 mg/dL (ref 0.0–1.2)
CO2: 22 mmol/L (ref 20–29)
Calcium: 8.9 mg/dL (ref 8.7–10.2)
Chloride: 101 mmol/L (ref 96–106)
Creatinine, Ser: 1.02 mg/dL (ref 0.76–1.27)
Globulin, Total: 2.4 g/dL (ref 1.5–4.5)
Glucose: 101 mg/dL — ABNORMAL HIGH (ref 70–99)
Potassium: 3.9 mmol/L (ref 3.5–5.2)
Sodium: 141 mmol/L (ref 134–144)
Total Protein: 7.1 g/dL (ref 6.0–8.5)
eGFR: 92 mL/min/{1.73_m2} (ref >59)

## 2022-06-17 LAB — PSA: Prostate Specific Ag, Serum: 0.5 ng/mL (ref 0.0–4.0)

## 2022-06-17 LAB — TESTOSTERONE, FREE, TOTAL, SHBG
Sex Hormone Binding: 16.8 nmol/L (ref 16.5–55.9)
Testosterone, Free: 6.1 pg/mL — ABNORMAL LOW (ref 6.8–21.5)
Testosterone: 305 ng/dL (ref 264–916)

## 2022-06-21 ENCOUNTER — Ambulatory Visit: Payer: BC Managed Care – PPO | Admitting: "Endocrinology

## 2022-06-21 ENCOUNTER — Encounter: Payer: Self-pay | Admitting: "Endocrinology

## 2022-06-21 VITALS — BP 116/82 | HR 64 | Ht 70.0 in | Wt 231.4 lb

## 2022-06-21 DIAGNOSIS — R7303 Prediabetes: Secondary | ICD-10-CM | POA: Diagnosis not present

## 2022-06-21 DIAGNOSIS — I10 Essential (primary) hypertension: Secondary | ICD-10-CM | POA: Diagnosis not present

## 2022-06-21 DIAGNOSIS — E291 Testicular hypofunction: Secondary | ICD-10-CM

## 2022-06-21 MED ORDER — TESTOSTERONE CYPIONATE 200 MG/ML IM SOLN: INTRAMUSCULAR | 1 refills | Status: DC

## 2022-06-21 NOTE — Progress Notes (Signed)
06/21/2022   Endocrinology follow-up note   Subjective:    Patient ID: Ian Robinson, male    DOB: October 05, 1975, PCP Pcp, No   Past Medical History:  Diagnosis Date   Arthritis    Asthma    GERD (gastroesophageal reflux disease)    History of kidney stones    Kidney stone    Scoliosis    Past Surgical History:  Procedure Laterality Date   APPENDECTOMY     arm fracture surgery     CARPAL TUNNEL RELEASE     left hand   CYST REMOVAL HAND  12/2021   ESOPHAGOGASTRODUODENOSCOPY N/A 09/08/2018   Procedure: ESOPHAGOGASTRODUODENOSCOPY (EGD);  Surgeon: Wonda Horner, MD;  Location: [facility];  Service: Endoscopy;  Laterality: N/A;   FOREIGN BODY REMOVAL N/A 09/08/2018   Procedure: FOREIGN BODY REMOVAL;  Surgeon: Wonda Horner, MD;  Location: [facility];  Service: Endoscopy;  Laterality: N/A;   FRACTURE SURGERY     left wrist   KNEE ARTHROSCOPY     right knee   TOTAL KNEE ARTHROPLASTY Left 03/08/2018   Procedure: LEFT TOTAL KNEE ARTHROPLASTY;  Surgeon: Latanya Maudlin, MD;  Location: [facility];  Service: Orthopedics;  Laterality: Left;  192min   Social History   Socioeconomic History   Marital status: Married    Spouse name: Not on file   Number of children: Not on file   Years of education: Not on file   Highest education level: Not on file  Occupational History   Not on file  Tobacco Use   Smoking status: Former    Types: Cigarettes    Quit date: 06/08/2007    Years since quitting: 15.0   Smokeless tobacco: Never  Vaping Use   Vaping Use: Never used  Substance and Sexual Activity   Alcohol use: Yes    Alcohol/week: 6.0 standard drinks of alcohol    Types: 6 Cans of beer per week    Comment: occ 2 to 3 times a week   Drug use: No   Sexual activity: Not on file  Other Topics Concern   Not on file  Social History Narrative   Not on file   Social Determinants of Health   Financial Resource Strain: Not on file  Food Insecurity: Not on file  Transportation  Needs: Not on file  Physical Activity: Not on file  Stress: Not on file  Social Connections: Not on file   Outpatient Encounter Medications as of 06/21/2022  Medication Sig   aspirin-acetaminophen-caffeine (EXCEDRIN MIGRAINE) 250-250-65 MG tablet Take 2 tablets by mouth every 6 (six) hours as needed for headache.   B-D 3CC LUER-LOK SYR 23GX1" 23G X 1" 3 ML MISC USE TO INJECT TESTOSTERONE   busPIRone (BUSPAR) 7.5 MG tablet Take 7.5 mg by mouth 3 (three) times daily as needed.   cetirizine (ZYRTEC) 10 MG tablet Take 10 mg by mouth daily.   fluticasone (FLONASE) 50 MCG/ACT nasal spray Place 1 spray into both nostrils daily as needed for allergies or rhinitis.   montelukast (SINGULAIR) 10 MG tablet Take 10 mg by mouth daily.    PROAIR HFA 108 (90 Base) MCG/ACT inhaler Inhale 2 Inhalers into the lungs every 6 (six) hours as needed for wheezing or shortness of breath.   Syringe/Needle, Disp, (SYRINGE 3CC/20GX1") 20G X 1" 3 ML MISC Use to inject Testosterone 2 times a month   testosterone cypionate (DEPOTESTOSTERONE CYPIONATE) 200 MG/ML injection INJECT ONE 1 ML EVERY 14 DAYS AS DIRECTED   [  DISCONTINUED] testosterone cypionate (DEPOTESTOSTERONE CYPIONATE) 200 MG/ML injection INJECT ONE ML EVERY 14 DAYS AS DIRECTED   No facility-administered encounter medications on file as of 06/21/2022.   ALLERGIES: Allergies  Allergen Reactions   Diclofenac     Upset stomach   Nsaids     Gastric ulcer    Other     Hay fever  Pistachios and cashews-itchy tongue     Percocet [Oxycodone-Acetaminophen]     migraines   VACCINATION STATUS:  There is no immunization history on file for this patient.  HPI  Ian Robinson is a 47 year old male patient with medical history as above.    -He is being seen in follow-up for hypogonadism with repeat labs.     He is currently on testosterone 200 mg IM every 14 days.  He feels better, reports compliance.  He has no new complaints today.  His previsit labs show total  testosterone of 305 3 days before his next injection on June 09, 2022.  -He did not father any children, saying that his wife has fertility problems. He was subjected to sperm analysis in the past when he was found to have "low sperm count and motility". He denies testicular injury, chemotherapy, radiation, nor STDs. He reiterated that they are not looking for fertility. His MRI is negative for pituitary/sella mass lesion nor adenoma. he has remote ( at age 29) past face injury with no associated intracranial injury. His prolactin level was also normal, see below.  He has never taken androgens, steroids, nor opiates. he has new c/o frequent sinus infections.   Review of Systems  Limited as above.  Objective:    BP 116/82   Pulse 64   Ht 5\' 10"  (1.778 m)   Wt 231 lb 6.4 oz (105 kg)   BMI 33.20 kg/m   Wt Readings from Last 3 Encounters:  06/21/22 231 lb 6.4 oz (105 kg)  01/28/22 230 lb 12.8 oz (104.7 kg)  08/31/21 234 lb 12.8 oz (106.5 kg)    Physical Exam   Recent Results (from the past 2160 hour(s))  Comprehensive metabolic panel     Status: Abnormal   Collection Time: 06/09/22 10:30 AM  Result Value Ref Range   Glucose 101 (H) 70 - 99 mg/dL   BUN 23 6 - 24 mg/dL   Creatinine, Ser 1.02 0.76 - 1.27 mg/dL   eGFR 92 >59 mL/min/1.73   BUN/Creatinine Ratio 23 (H) 9 - 20   Sodium 141 134 - 144 mmol/L   Potassium 3.9 3.5 - 5.2 mmol/L   Chloride 101 96 - 106 mmol/L   CO2 22 20 - 29 mmol/L   Calcium 8.9 8.7 - 10.2 mg/dL   Total Protein 7.1 6.0 - 8.5 g/dL   Albumin 4.7 4.1 - 5.1 g/dL   Globulin, Total 2.4 1.5 - 4.5 g/dL   Albumin/Globulin Ratio 2.0 1.2 - 2.2   Bilirubin Total 1.1 0.0 - 1.2 mg/dL   Alkaline Phosphatase 49 44 - 121 IU/L   AST 23 0 - 40 IU/L   ALT 27 0 - 44 IU/L  PSA     Status: None   Collection Time: 06/09/22 10:30 AM  Result Value Ref Range   Prostate Specific Ag, Serum 0.5 0.0 - 4.0 ng/mL    Comment: Roche ECLIA methodology. According to the American  Urological Association, Serum PSA should decrease and remain at undetectable levels after radical prostatectomy. The AUA defines biochemical recurrence as an initial PSA value 0.2 ng/mL or greater followed  by a subsequent confirmatory PSA value 0.2 ng/mL or greater. Values obtained with different assay methods or kits cannot be used interchangeably. Results cannot be interpreted as absolute evidence of the presence or absence of malignant disease.   Testosterone, Free, Total, SHBG     Status: Abnormal   Collection Time: 06/09/22 10:30 AM  Result Value Ref Range   Testosterone 305 264 - 916 ng/dL    Comment: Adult male reference interval is based on a population of healthy nonobese males (BMI <30) between 65 and 70 years old. Travison, et.al. JCEM [id number]. PMID: 61950932.    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   He wishes to be continued on testosterone replacement therapy.  His previsit labs show total testosterone of 305 3 days before his next injection.   This is acceptable level for him and does not need any increased in his doses.  No adverse effects from testosterone so far. -He is advised to continue testosterone cypionate 200 mg IM every 14 days with plan to repeat labs in 4 months. -His CMP, CBC, PSA are favorable so far.  He will have CBC, PSA along with his next testosterone measurements before his next visit informed    His MRI is negative for sella/pituitary mass lesions. His PRL is normal at 13.3.  2. Prediabetes: -His point-of-care A1c is 5.7%, he is not on any medication intervention for prediabetes.    - he acknowledges that there is a room for improvement in his food and drink choices. - Suggestion is made for him to avoid simple carbohydrates  from  his diet including Cakes, Sweet Desserts, Ice Cream, Soda (diet and regular), Sweet Tea, Candies, Chips, Cookies, Store Bought Juices, Alcohol in Excess of  1-2 drinks a day, Artificial Sweeteners,  Coffee Creamer, and "Sugar-free" Products, Lemonade. This will help patient to have more stable blood glucose profile and potentially avoid unintended weight gain.   3. Hypertension:  -His blood pressure is controlled to target.   He has no blood pressure medication at this time.  This is mainly due to recent significant weight loss.    - I advised patient to maintain close follow up with his PCP for primary care needs.   He is advised to maintain close follow-up with his PCP for primary care needs.  I spent 23 minutes in the care of the patient today including review of labs from Thyroid Function, CMP, and other relevant labs ; imaging/biopsy records (current and previous including abstractions from other facilities); face-to-face time discussing  his lab results and symptoms, medications doses, his options of short and long term treatment based on the latest standards of care / guidelines;   and documenting the encounter.  Gara Kroner  participated in the discussions, expressed understanding, and voiced agreement with the above plans.  All questions were answered to his satisfaction. he is encouraged to contact clinic should he have any questions or concerns prior to his return visit.    Follow up plan: Return in about 4 months (around 10/20/2022) for Fasting Labs  in AM B4 8.  Marquis Lunch, MD Phone: [phone]  Fax: [phone]   -  This note was partially dictated with voice recognition software. Similar sounding words can be transcribed inadequately or may not  be corrected upon  review.  06/21/2022, 6:27 PM

## 2022-08-11 HISTORY — PX: COLONOSCOPY: SHX174

## 2022-08-19 ENCOUNTER — Other Ambulatory Visit (HOSPITAL_COMMUNITY): Payer: Self-pay

## 2022-09-21 ENCOUNTER — Telehealth: Payer: Self-pay

## 2022-09-21 NOTE — Telephone Encounter (Signed)
PA request received via CMM for Testosterone Cypionate /ML intramuscular solution  PA has been submitted to [facility] and has been APPROVED from 09/21/2022-09/20/2023  Key: [id number]

## 2022-10-21 LAB — CBC WITH DIFFERENTIAL/PLATELET
Hematocrit: 49.4 % (ref 37.5–51.0)
RBC: 5.66 x10E6/uL (ref 4.14–5.80)
RDW: 14 % (ref 11.6–15.4)

## 2022-10-21 LAB — TESTOSTERONE, FREE, TOTAL, SHBG: Sex Hormone Binding: 18.4 nmol/L (ref 16.5–55.9)

## 2022-10-26 LAB — CBC WITH DIFFERENTIAL/PLATELET
Basophils Absolute: 0 10*3/uL (ref 0.0–0.2)
Basos: 1 %
EOS (ABSOLUTE): 0.2 10*3/uL (ref 0.0–0.4)
Eos: 5 %
Hemoglobin: 16.6 g/dL (ref 13.0–17.7)
Immature Grans (Abs): 0 10*3/uL (ref 0.0–0.1)
Immature Granulocytes: 0 %
Lymphocytes Absolute: 1.3 10*3/uL (ref 0.7–3.1)
Lymphs: 28 %
MCH: 29.3 pg (ref 26.6–33.0)
MCHC: 33.6 g/dL (ref 31.5–35.7)
MCV: 87 fL (ref 79–97)
Monocytes Absolute: 0.3 10*3/uL (ref 0.1–0.9)
Monocytes: 6 %
Neutrophils Absolute: 2.8 10*3/uL (ref 1.4–7.0)
Neutrophils: 60 %
Platelets: 204 10*3/uL (ref 150–450)
WBC: 4.6 10*3/uL (ref 3.4–10.8)

## 2022-10-26 LAB — TESTOSTERONE, FREE, TOTAL, SHBG
Testosterone, Free: 19.5 pg/mL (ref 6.8–21.5)
Testosterone: 890 ng/dL (ref 264–916)

## 2022-10-26 LAB — PSA: Prostate Specific Ag, Serum: 0.5 ng/mL (ref 0.0–4.0)

## 2022-10-29 ENCOUNTER — Ambulatory Visit: Payer: BC Managed Care – PPO | Admitting: "Endocrinology

## 2022-10-29 ENCOUNTER — Encounter: Payer: Self-pay | Admitting: "Endocrinology

## 2022-10-29 VITALS — BP 114/82 | HR 68 | Ht 70.0 in | Wt 220.6 lb

## 2022-10-29 DIAGNOSIS — R7303 Prediabetes: Secondary | ICD-10-CM | POA: Diagnosis not present

## 2022-10-29 DIAGNOSIS — I10 Essential (primary) hypertension: Secondary | ICD-10-CM

## 2022-10-29 DIAGNOSIS — E291 Testicular hypofunction: Secondary | ICD-10-CM

## 2022-10-29 MED ORDER — TESTOSTERONE CYPIONATE 200 MG/ML IM SOLN: INTRAMUSCULAR | 1 refills | Status: DC

## 2022-10-29 NOTE — Progress Notes (Signed)
10/29/2022   Endocrinology follow-up note   Subjective:    Patient ID: Ian Robinson, male    DOB: Nov 15, 1975, PCP Pcp, No   Past Medical History:  Diagnosis Date   Arthritis    Asthma    GERD (gastroesophageal reflux disease)    History of kidney stones    Kidney stone    Scoliosis    Past Surgical History:  Procedure Laterality Date   APPENDECTOMY     arm fracture surgery     CARPAL TUNNEL RELEASE     left hand   COLONOSCOPY  08/11/2022   CYST REMOVAL HAND  12/2021   ESOPHAGOGASTRODUODENOSCOPY N/A 09/08/2018   Procedure: ESOPHAGOGASTRODUODENOSCOPY (EGD);  Surgeon: Graylin Shiver, MD;  Location: [facility];  Service: Endoscopy;  Laterality: N/A;   FOREIGN BODY REMOVAL N/A 09/08/2018   Procedure: FOREIGN BODY REMOVAL;  Surgeon: Graylin Shiver, MD;  Location: [facility];  Service: Endoscopy;  Laterality: N/A;   FRACTURE SURGERY     left wrist   KNEE ARTHROSCOPY     right knee   TOTAL KNEE ARTHROPLASTY Left 03/08/2018   Procedure: LEFT TOTAL KNEE ARTHROPLASTY;  Surgeon: Ranee Gosselin, MD;  Location: [facility];  Service: Orthopedics;  Laterality: Left;    Social History   Socioeconomic History   Marital status: Married    Spouse name: Not on file   Number of children: Not on file   Years of education: Not on file   Highest education level: Not on file  Occupational History   Not on file  Tobacco Use   Smoking status: Former    Types: Cigarettes    Quit date: 06/08/2007    Years since quitting: 15.4   Smokeless tobacco: Never  Vaping Use   Vaping Use: Never used  Substance and Sexual Activity   Alcohol use: Yes    Alcohol/week: 6.0 standard drinks of alcohol    Types: 6 Cans of beer per week    Comment: occ 2 to 3 times a week   Drug use: No   Sexual activity: Not on file  Other Topics Concern   Not on file  Social History Narrative   Not on file   Social Determinants of Health   Financial Resource Strain: Not on file  Food Insecurity: Not  on file  Transportation Needs: Not on file  Physical Activity: Not on file  Stress: Not on file  Social Connections: Not on file   Outpatient Encounter Medications as of 10/29/2022  Medication Sig   aspirin-acetaminophen-caffeine (EXCEDRIN MIGRAINE) 250-250-65 MG tablet Take 2 tablets by mouth every 6 (six) hours as needed for headache.   B-D 3CC LUER-LOK SYR 23GX1" 23G X 1" 3 ML MISC USE TO INJECT TESTOSTERONE   busPIRone (BUSPAR) 7.5 MG tablet Take 7.5 mg by mouth 3 (three) times daily as needed.   cetirizine (ZYRTEC) 10 MG tablet Take 10 mg by mouth daily.   fluticasone (FLONASE) 50 MCG/ACT nasal spray Place 1 spray into both nostrils daily as needed for allergies or rhinitis.   montelukast (SINGULAIR) 10 MG tablet Take 10 mg by mouth daily.    PROAIR HFA 108 (90 Base) MCG/ACT inhaler Inhale 2 Inhalers into the lungs every 6 (six) hours as needed for wheezing or shortness of breath.   Syringe/Needle, Disp, (SYRINGE 3CC/20GX1") 20G X 1" 3 ML MISC Use to inject Testosterone 2 times a month   testosterone cypionate (DEPOTESTOSTERONE CYPIONATE) 200 MG/ML injection INJECT ONE 1 ML EVERY 14  DAYS AS DIRECTED   [DISCONTINUED] testosterone cypionate (DEPOTESTOSTERONE CYPIONATE) 200 MG/ML injection INJECT ONE 1 ML EVERY 14 DAYS AS DIRECTED   No facility-administered encounter medications on file as of 10/29/2022.   ALLERGIES: Allergies  Allergen Reactions   Diclofenac     Upset stomach   Nsaids     Gastric ulcer    Other     Hay fever  Pistachios and cashews-itchy tongue     Percocet [Oxycodone-Acetaminophen]     migraines   VACCINATION STATUS:  There is no immunization history on file for this patient.  HPI  Ian Robinson is a 47 year old male patient with medical history as above.    -He is being seen in follow-up for hypogonadism with repeat labs.     He is currently on testosterone 200 mg IM every 14 days.  He feels better, reports compliance.  He has no new complaints today.  His  previsit labs show total testosterone of 890  3 days after his injection.    -He did not father any children, saying that his wife has fertility problems. He was subjected to sperm analysis in the past when he was found to have "low sperm count and motility". He denies testicular injury, chemotherapy, radiation, nor STDs. He reiterated that they are not looking for fertility. His MRI is negative for pituitary/sella mass lesion nor adenoma. he has remote ( at age 74) past face injury with no associated intracranial injury. His prolactin level was also normal, see below.  He has never taken androgens, steroids, nor opiates. he has new c/o frequent sinus infections.   Review of Systems  Limited as above.  Objective:    BP 114/82   Pulse 68   Ht 5\' 10"  (1.778 m)   Wt 220 lb 9.6 oz (100.1 kg)   BMI 31.65 kg/m   Wt Readings from Last 3 Encounters:  10/29/22 220 lb 9.6 oz (100.1 kg)  06/21/22 231 lb 6.4 oz (105 kg)  01/28/22 230 lb 12.8 oz (104.7 kg)    Physical Exam   Recent Results (from the past 2160 hour(s))  CBC with Differential/Platelet     Status: None   Collection Time: 10/20/22  9:39 AM  Result Value Ref Range   WBC 4.6 3.4 - 10.8 x10E3/uL   RBC 5.66 4.14 - 5.80 x10E6/uL   Hemoglobin 16.6 13.0 - 17.7 g/dL   Hematocrit 40.9 81.1 - 51.0 %   MCV 87 79 - 97 fL   MCH 29.3 26.6 - 33.0 pg   MCHC 33.6 31.5 - 35.7 g/dL   RDW 91.4 78.2 - 95.6 %   Platelets 204 150 - 450 x10E3/uL   Neutrophils 60 Not Estab. %   Lymphs 28 Not Estab. %   Monocytes 6 Not Estab. %   Eos 5 Not Estab. %   Basos 1 Not Estab. %   Neutrophils Absolute 2.8 1.4 - 7.0 x10E3/uL   Lymphocytes Absolute 1.3 0.7 - 3.1 x10E3/uL   Monocytes Absolute 0.3 0.1 - 0.9 x10E3/uL   EOS (ABSOLUTE) 0.2 0.0 - 0.4 x10E3/uL   Basophils Absolute 0.0 0.0 - 0.2 x10E3/uL   Immature Granulocytes 0 Not Estab. %   Immature Grans (Abs) 0.0 0.0 - 0.1 x10E3/uL  PSA     Status: None   Collection Time: 10/20/22  9:39 AM  Result  Value Ref Range   Prostate Specific Ag, Serum 0.5 0.0 - 4.0 ng/mL    Comment: Roche ECLIA methodology. According to the American Urological Association,  Serum PSA should decrease and remain at undetectable levels after radical prostatectomy. The AUA defines biochemical recurrence as an initial PSA value 0.2 ng/mL or greater followed by a subsequent confirmatory PSA value 0.2 ng/mL or greater. Values obtained with different assay methods or kits cannot be used interchangeably. Results cannot be interpreted as absolute evidence of the presence or absence of malignant disease.   Testosterone, Free, Total, SHBG     Status: None   Collection Time: 10/20/22  9:39 AM  Result Value Ref Range   Testosterone 890 264 - 916 ng/dL    Comment: Adult male reference interval is based on a population of healthy nonobese males (BMI <30) between 41 and 68 years old. Travison, et.al. JCEM [id number]. PMID: [id number].    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   He wishes to be continued on testosterone replacement therapy.  His previsit labs show total testosterone of 893 days after his injection.     This is acceptable level for him and does not need any increased in his doses.  No adverse effects from testosterone so far. -He is advised to continue testosterone cypionate 200 mg IM every 14 days with plan to repeat labs in 4 months. -His CMP, CBC, PSA are favorable so far.  He will have CBC, PSA along with his next testosterone measurements before his next visit informed    His MRI is negative for sella/pituitary mass lesions. His PRL is normal at 13.3.  2. Prediabetes: -His point-of-care A1c is 5.7%, he is not on any medication intervention for prediabetes.     - he acknowledges that there  is a room for improvement in his food and drink choices. - Suggestion is made for him to avoid simple carbohydrates  from his diet including Cakes, Sweet Desserts, Ice Cream, Soda (diet and regular), Sweet Tea, Candies, Chips, Cookies, Store Bought Juices, Alcohol in Excess of  1-2 drinks a day, Artificial Sweeteners,  Coffee Creamer, and "Sugar-free" Products, Lemonade. This will help patient to have more stable blood glucose profile and potentially avoid unintended weight gain. 3. Hypertension:  -His blood pressure is controlled to target.   He has no blood pressure medication at this time.  This is mainly due to recent significant weight loss.    - I advised patient to maintain close follow up with his PCP for primary care needs.   He is advised to maintain close follow-up with his PCP for primary care needs.  I spent  22  minutes in the care of the patient today including review of labs from Thyroid Function, CMP, and other relevant labs ; imaging/biopsy records (current and previous including abstractions from other facilities); face-to-face time discussing  his lab results and symptoms, medications doses, his options of short and long term treatment based on the latest standards of care / guidelines;   and documenting the encounter.  Gara Kroner  participated in the discussions, expressed understanding, and voiced agreement with the above plans.  All questions were answered to his satisfaction. he is encouraged to contact clinic should he have any questions or concerns prior to his return visit.    Follow up plan: Return in about 4 months (around 03/01/2023) for Fasting Labs  in AM B4 8.  Marquis Lunch, MD Phone: [phone]  Fax:  [phone]   -  This note was partially dictated with voice recognition software. Similar sounding words can be transcribed inadequately or may not  be corrected upon review.  10/29/2022, 10:32 AM

## 2023-02-18 ENCOUNTER — Other Ambulatory Visit: Payer: Self-pay | Admitting: "Endocrinology

## 2023-02-25 LAB — COMPREHENSIVE METABOLIC PANEL
ALT: 21 IU/L (ref 0–44)
AST: 17 IU/L (ref 0–40)
Albumin: 4.8 g/dL (ref 4.1–5.1)
Alkaline Phosphatase: 43 IU/L — ABNORMAL LOW (ref 44–121)
BUN/Creatinine Ratio: 13 (ref 9–20)
BUN: 14 mg/dL (ref 6–24)
Bilirubin Total: 0.9 mg/dL (ref 0.0–1.2)
CO2: 22 mmol/L (ref 20–29)
Calcium: 9.6 mg/dL (ref 8.7–10.2)
Chloride: 102 mmol/L (ref 96–106)
Creatinine, Ser: 1.04 mg/dL (ref 0.76–1.27)
Globulin, Total: 2.2 g/dL (ref 1.5–4.5)
Glucose: 103 mg/dL — ABNORMAL HIGH (ref 70–99)
Potassium: 4 mmol/L (ref 3.5–5.2)
Sodium: 139 mmol/L (ref 134–144)
Total Protein: 7 g/dL (ref 6.0–8.5)
eGFR: 89 mL/min/{1.73_m2} (ref >59)

## 2023-02-25 LAB — TESTOSTERONE, FREE, TOTAL, SHBG
Sex Hormone Binding: 17 nmol/L (ref 16.5–55.9)
Testosterone, Free: 26.4 pg/mL — ABNORMAL HIGH (ref 6.8–21.5)
Testosterone: 1179 ng/dL — ABNORMAL HIGH (ref 264–916)

## 2023-03-01 ENCOUNTER — Ambulatory Visit: Payer: BC Managed Care – PPO | Admitting: "Endocrinology

## 2023-03-01 ENCOUNTER — Encounter: Payer: Self-pay | Admitting: "Endocrinology

## 2023-03-01 VITALS — BP 124/84 | HR 72 | Ht 70.0 in | Wt 227.0 lb

## 2023-03-01 DIAGNOSIS — E291 Testicular hypofunction: Secondary | ICD-10-CM | POA: Diagnosis not present

## 2023-03-01 DIAGNOSIS — R7303 Prediabetes: Secondary | ICD-10-CM | POA: Diagnosis not present

## 2023-03-01 DIAGNOSIS — I10 Essential (primary) hypertension: Secondary | ICD-10-CM

## 2023-03-01 MED ORDER — TESTOSTERONE CYPIONATE 200 MG/ML IM SOLN: 100.0000 mg | INTRAMUSCULAR | 0 refills | Status: DC

## 2023-03-01 NOTE — Progress Notes (Signed)
03/01/2023   Endocrinology follow-up note   Subjective:    Patient ID: Ian Robinson, male    DOB: 1975/12/01, PCP Pcp, No   Past Medical History:  Diagnosis Date   Arthritis    Asthma    GERD (gastroesophageal reflux disease)    History of kidney stones    Kidney stone    Scoliosis    Past Surgical History:  Procedure Laterality Date   APPENDECTOMY     arm fracture surgery     CARPAL TUNNEL RELEASE     left hand   COLONOSCOPY  08/11/2022   CYST REMOVAL HAND  12/2021   ESOPHAGOGASTRODUODENOSCOPY N/A 09/08/2018   Procedure: ESOPHAGOGASTRODUODENOSCOPY (EGD);  Surgeon: Graylin Shiver, MD;  Location: [facility];  Service: Endoscopy;  Laterality: N/A;   FOREIGN BODY REMOVAL N/A 09/08/2018   Procedure: FOREIGN BODY REMOVAL;  Surgeon: Graylin Shiver, MD;  Location: [facility];  Service: Endoscopy;  Laterality: N/A;   FRACTURE SURGERY     left wrist   KNEE ARTHROSCOPY     right knee   TOTAL KNEE ARTHROPLASTY Left 03/08/2018   Procedure: LEFT TOTAL KNEE ARTHROPLASTY;  Surgeon: Ranee Gosselin, MD;  Location: [facility];  Service: Orthopedics;  Laterality: Left;    Social History   Socioeconomic History   Marital status: Married    Spouse name: Not on file   Number of children: Not on file   Years of education: Not on file   Highest education level: Not on file  Occupational History   Not on file  Tobacco Use   Smoking status: Former    Current packs/day: 0.00    Types: Cigarettes    Quit date: 06/08/2007    Years since quitting: 15.7   Smokeless tobacco: Never  Vaping Use   Vaping status: Never Used  Substance and Sexual Activity   Alcohol use: Yes    Alcohol/week: 6.0 standard drinks of alcohol    Types: 6 Cans of beer per week    Comment: occ 2 to 3 times a week   Drug use: No   Sexual activity: Not on file  Other Topics Concern   Not on file  Social History Narrative   Not on file   Social Determinants of Health   Financial Resource Strain: Not  on file  Food Insecurity: Not on file  Transportation Needs: Not on file  Physical Activity: Not on file  Stress: Not on file  Social Connections: Not on file   Outpatient Encounter Medications as of 03/01/2023  Medication Sig   aspirin-acetaminophen-caffeine (EXCEDRIN MIGRAINE) 250-250-65 MG tablet Take 2 tablets by mouth every 6 (six) hours as needed for headache.   B-D 3CC LUER-LOK SYR 23GX1" 23G X 1" 3 ML MISC USE TO INJECT TESTOSTERONE   busPIRone (BUSPAR) 7.5 MG tablet Take 7.5 mg by mouth 3 (three) times daily as needed.   cetirizine (ZYRTEC) 10 MG tablet Take 10 mg by mouth daily.   fluticasone (FLONASE) 50 MCG/ACT nasal spray Place 1 spray into both nostrils daily as needed for allergies or rhinitis.   montelukast (SINGULAIR) 10 MG tablet Take 10 mg by mouth daily.    PROAIR HFA 108 (90 Base) MCG/ACT inhaler Inhale 2 Inhalers into the lungs every 6 (six) hours as needed for wheezing or shortness of breath.   Syringe/Needle, Disp, (SYRINGE 3CC/20GX1") 20G X 1" 3 ML MISC Use to inject Testosterone 2 times a month   testosterone cypionate (DEPOTESTOSTERONE CYPIONATE) 200 MG/ML injection  Inject 0.5 mLs (100 mg total) into the muscle every 14 (fourteen) days. INJECT 0.5 ML (100MG ) EVERY 14 DAYS AS DIRECTED TO GET 200MG  MONTHLY DOSE   [DISCONTINUED] testosterone cypionate (DEPOTESTOSTERONE CYPIONATE) 200 MG/ML injection INJECT ONE 1 ML EVERY 14 DAYS AS DIRECTED   No facility-administered encounter medications on file as of 03/01/2023.   ALLERGIES: Allergies  Allergen Reactions   Diclofenac     Upset stomach   Nsaids     Gastric ulcer    Other     Hay fever  Pistachios and cashews-itchy tongue     Percocet [Oxycodone-Acetaminophen]     migraines   VACCINATION STATUS:  There is no immunization history on file for this patient.  HPI  [name] is a 47 year old male patient with medical history as above.    -He is being seen in follow-up for hypogonadism with repeat labs.      He is currently on testosterone 200 mg IM every 14 days.  He feels better, reports compliance.  He has no new complaints today.  His previsit labs show total testosterone of  1179 increasing from 890  4 days after his injection.    -He did not father any children, saying that his wife has fertility problems. He was subjected to sperm analysis in the past when he was found to have "low sperm count and motility". He denies testicular injury, chemotherapy, radiation, nor STDs. He reiterated that they are not looking for fertility. His MRI is negative for pituitary/sella mass lesion nor adenoma. he has remote ( at age 51) past face injury with no associated intracranial injury. His prolactin level was also normal, see below.  He has never taken androgens, steroids, nor opiates. he has new c/o frequent sinus infections.   Review of Systems  Limited as above.  Objective:    BP 124/84   Pulse 72   Ht 5\' 10"  (1.778 m)   Wt 227 lb (103 kg)   BMI 32.57 kg/m   Wt Readings from Last 3 Encounters:  03/01/23 227 lb (103 kg)  10/29/22 220 lb 9.6 oz (100.1 kg)  06/21/22 231 lb 6.4 oz (105 kg)    Physical Exam   Recent Results (from the past 2160 hour(s))  Testosterone, Free, Total, SHBG     Status: Abnormal   Collection Time: 02/22/23  9:06 AM  Result Value Ref Range   Testosterone 1,179 (H) 264 - 916 ng/dL    Comment: Adult male reference interval is based on a population of healthy nonobese males (BMI <30) between 75 and 62 years old. Travison, et.al. JCEM [id number]. PMID: 02725366.    Testosterone, Free 26.4 (H) 6.8 - 21.5 pg/mL   Sex Hormone Binding 17.0 16.5 - 55.9 nmol/L  Comprehensive metabolic panel     Status: Abnormal   Collection Time: 02/22/23  9:06 AM  Result Value Ref Range   Glucose 103 (H) 70 - 99 mg/dL   BUN 14 6 - 24 mg/dL   Creatinine, Ser 4.40 0.76 - 1.27 mg/dL   eGFR 89 >34 VQ/QVZ/5.63   BUN/Creatinine Ratio 13 9 - 20   Sodium 139 134 - 144 mmol/L    Potassium 4.0 3.5 - 5.2 mmol/L   Chloride 102 96 - 106 mmol/L   CO2 22 20 - 29 mmol/L   Calcium 9.6 8.7 - 10.2 mg/dL   Total Protein 7.0 6.0 - 8.5 g/dL   Albumin 4.8 4.1 - 5.1 g/dL   Globulin, Total 2.2 1.5 - 4.5 g/dL  Bilirubin Total 0.9 0.0 - 1.2 mg/dL   Alkaline Phosphatase 43 (L) 44 - 121 IU/L   AST 17 0 - 40 IU/L   ALT 21 0 - 44 IU/L    Assessment & Plan:   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   He wishes to be continued on testosterone replacement therapy.  His previsit labs show total testosterone of  1179 increasing from 893 , 4 days after his injection.    No adverse effects of testosterone so far.  He is approached for lower dose until next measurement and he agrees. - He is advised to lower his testosterone  cypionate to 100 mg IM every 14 days with plan to repeat labs in 4 months. -His CMP, CBC, PSA are favorable so far.  He will have CBC, PSA along with his next testosterone measurements before his next visit informed    His MRI is negative for sella/pituitary mass lesions. His PRL is normal at 13.3.  2. Prediabetes: -His recent point-of-care A1c was 5.7%, he is not on any medication intervention for prediabetes.     - he acknowledges that there is a room for improvement in his food and drink choices. - Suggestion is made for him to avoid simple carbohydrates  from his diet including Cakes, Sweet Desserts, Ice Cream, Soda (diet and regular), Sweet Tea, Candies, Chips, Cookies, Store Bought Juices, Alcohol in Excess of  1-2 drinks a day, Artificial Sweeteners,  Coffee Creamer, and "Sugar-free" Products, Lemonade. This will help patient to have more stable blood glucose profile and potentially avoid unintended weight gain.  3. Hypertension:  -His blood pressure is controlled to target.   He has no blood pressure medication at this time.  This is mainly due to recent significant  weight loss.    - I advised patient to maintain close follow up with his PCP for primary care needs.   He is advised to maintain close follow-up with his PCP for primary care needs.   I spent  22  minutes in the care of the patient today including review of labs from Thyroid Function, CMP, and other relevant labs ; imaging/biopsy records (current and previous including abstractions from other facilities); face-to-face time discussing  his lab results and symptoms, medications doses, his options of short and long term treatment based on the latest standards of care / guidelines;   and documenting the encounter.  Gara Kroner  participated in the discussions, expressed understanding, and voiced agreement with the above plans.  All questions were answered to his satisfaction. he is encouraged to contact clinic should he have any questions or concerns prior to his return visit.    Follow up plan: Return in about 4 months (around 07/01/2023) for Fasting Labs  in AM B4 8.  Marquis Lunch, MD Phone: [phone]  Fax: [phone]   -  This note was partially dictated with voice recognition software. Similar sounding words can be transcribed inadequately or may not  be corrected upon review.  03/01/2023, 11:09 AM

## 2023-06-27 LAB — TESTOSTERONE, FREE, TOTAL, SHBG
Sex Hormone Binding: 19.6 nmol/L (ref 16.5–55.9)
Testosterone, Free: 4.9 pg/mL — ABNORMAL LOW (ref 6.8–21.5)
Testosterone: 226 ng/dL — ABNORMAL LOW (ref 264–916)

## 2023-06-27 LAB — CBC WITH DIFFERENTIAL/PLATELET
Basophils Absolute: 0.1 10*3/uL (ref 0.0–0.2)
Basos: 1 %
EOS (ABSOLUTE): 0.3 10*3/uL (ref 0.0–0.4)
Eos: 6 %
Hematocrit: 45.6 % (ref 37.5–51.0)
Hemoglobin: 15.1 g/dL (ref 13.0–17.7)
Immature Grans (Abs): 0 10*3/uL (ref 0.0–0.1)
Immature Granulocytes: 0 %
Lymphocytes Absolute: 1.4 10*3/uL (ref 0.7–3.1)
Lymphs: 28 %
MCH: 29.5 pg (ref 26.6–33.0)
MCHC: 33.1 g/dL (ref 31.5–35.7)
MCV: 89 fL (ref 79–97)
Monocytes Absolute: 0.3 10*3/uL (ref 0.1–0.9)
Monocytes: 7 %
Neutrophils Absolute: 2.8 10*3/uL (ref 1.4–7.0)
Neutrophils: 58 %
Platelets: 181 10*3/uL (ref 150–450)
RBC: 5.12 x10E6/uL (ref 4.14–5.80)
RDW: 13.8 % (ref 11.6–15.4)
WBC: 4.9 10*3/uL (ref 3.4–10.8)

## 2023-06-27 LAB — PSA: Prostate Specific Ag, Serum: 0.5 ng/mL (ref 0.0–4.0)

## 2023-06-30 ENCOUNTER — Encounter: Payer: Self-pay | Admitting: "Endocrinology

## 2023-06-30 ENCOUNTER — Ambulatory Visit: Payer: BC Managed Care – PPO | Admitting: "Endocrinology

## 2023-06-30 VITALS — BP 108/76 | HR 92 | Ht 70.0 in | Wt 228.6 lb

## 2023-06-30 DIAGNOSIS — E291 Testicular hypofunction: Secondary | ICD-10-CM

## 2023-06-30 DIAGNOSIS — I10 Essential (primary) hypertension: Secondary | ICD-10-CM | POA: Diagnosis not present

## 2023-06-30 DIAGNOSIS — R7303 Prediabetes: Secondary | ICD-10-CM | POA: Diagnosis not present

## 2023-06-30 MED ORDER — TESTOSTERONE CYPIONATE 200 MG/ML IM SOLN: 150.0000 mg | INTRAMUSCULAR | 0 refills | Status: DC

## 2023-06-30 NOTE — Progress Notes (Signed)
06/30/2023   Endocrinology follow-up note   Subjective:    Patient ID: Ian Robinson, male    DOB: 09/02/1975, PCP Pcp, No   Past Medical History:  Diagnosis Date   Arthritis    Asthma    GERD (gastroesophageal reflux disease)    History of kidney stones    Kidney stone    Scoliosis    Past Surgical History:  Procedure Laterality Date   APPENDECTOMY     arm fracture surgery     CARPAL TUNNEL RELEASE     left hand   COLONOSCOPY  08/11/2022   CYST REMOVAL HAND  12/2021   ESOPHAGOGASTRODUODENOSCOPY N/A 09/08/2018   Procedure: ESOPHAGOGASTRODUODENOSCOPY (EGD);  Surgeon: Graylin Shiver, MD;  Location: [facility];  Service: Endoscopy;  Laterality: N/A;   FOREIGN BODY REMOVAL N/A 09/08/2018   Procedure: FOREIGN BODY REMOVAL;  Surgeon: Graylin Shiver, MD;  Location: [facility];  Service: Endoscopy;  Laterality: N/A;   FRACTURE SURGERY     left wrist   KNEE ARTHROSCOPY     right knee   TOTAL KNEE ARTHROPLASTY Left 03/08/2018   Procedure: LEFT TOTAL KNEE ARTHROPLASTY;  Surgeon: Ranee Gosselin, MD;  Location: [facility];  Service: Orthopedics;  Laterality: Left;    Social History   Socioeconomic History   Marital status: Married    Spouse name: Not on file   Number of children: Not on file   Years of education: Not on file   Highest education level: Not on file  Occupational History   Not on file  Tobacco Use   Smoking status: Former    Current packs/day: 0.00    Types: Cigarettes    Quit date: 06/08/2007    Years since quitting: 16.0   Smokeless tobacco: Never  Vaping Use   Vaping status: Never Used  Substance and Sexual Activity   Alcohol use: Yes    Alcohol/week: 6.0 standard drinks of alcohol    Types: 6 Cans of beer per week    Comment: occ 2 to 3 times a week   Drug use: No   Sexual activity: Not on file  Other Topics Concern   Not on file  Social History Narrative   Not on file   Social Drivers of Health   Financial Resource Strain: Not on  file  Food Insecurity: Not on file  Transportation Needs: Not on file  Physical Activity: Not on file  Stress: Not on file  Social Connections: Not on file   Outpatient Encounter Medications as of 06/30/2023  Medication Sig   aspirin-acetaminophen-caffeine (EXCEDRIN MIGRAINE) 250-250-65 MG tablet Take 2 tablets by mouth every 6 (six) hours as needed for headache.   B-D 3CC LUER-LOK SYR 23GX1" 23G X 1" 3 ML MISC USE TO INJECT TESTOSTERONE   busPIRone (BUSPAR) 7.5 MG tablet Take 7.5 mg by mouth 3 (three) times daily as needed.   cetirizine (ZYRTEC) 10 MG tablet Take 10 mg by mouth daily.   fluticasone (FLONASE) 50 MCG/ACT nasal spray Place 1 spray into both nostrils daily as needed for allergies or rhinitis.   montelukast (SINGULAIR) 10 MG tablet Take 10 mg by mouth daily.    PROAIR HFA 108 (90 Base) MCG/ACT inhaler Inhale 2 Inhalers into the lungs every 6 (six) hours as needed for wheezing or shortness of breath.   Syringe/Needle, Disp, (SYRINGE 3CC/20GX1") 20G X 1" 3 ML MISC Use to inject Testosterone 2 times a month   testosterone cypionate (DEPOTESTOSTERONE CYPIONATE) 200 MG/ML injection  Inject 0.75 mLs (150 mg total) into the muscle every 14 (fourteen) days.   [DISCONTINUED] testosterone cypionate (DEPOTESTOSTERONE CYPIONATE) 200 MG/ML injection Inject 0.5 mLs (100 mg total) into the muscle every 14 (fourteen) days. INJECT 0.5 ML (100MG ) EVERY 14 DAYS AS DIRECTED TO GET 200MG  MONTHLY DOSE   No facility-administered encounter medications on file as of 06/30/2023.   ALLERGIES: Allergies  Allergen Reactions   Diclofenac     Upset stomach   Nsaids     Gastric ulcer    Other     Hay fever  Pistachios and cashews-itchy tongue     Percocet [Oxycodone-Acetaminophen]     migraines   VACCINATION STATUS:  There is no immunization history on file for this patient.  HPI  Mr. Relles is a 48 year old male patient with medical history as above.    -He is being seen in follow-up for  hypogonadism with repeat labs.     He is currently on testosterone 150 mg IM every 14 days.    He feels better, reports compliance.  He has no new complaints today.  His previsit labs show total testosterone of 226 just before his next injection of testosterone.    -He did not father any children, saying that his wife has fertility problems. He was subjected to sperm analysis in the past when he was found to have "low sperm count and motility". He denies testicular injury, chemotherapy, radiation, nor STDs. He reiterated that they are not looking for fertility. His MRI is negative for pituitary/sella mass lesion nor adenoma. he has remote ( at age 40) past face injury with no associated intracranial injury. His prolactin level was also normal, see below.  He has never taken androgens, steroids, nor opiates. he has new c/o frequent sinus infections.   Review of Systems  Limited as above.  Objective:    BP 108/76   Pulse 92   Ht 5\' 10"  (1.778 m)   Wt 228 lb 9.6 oz (103.7 kg)   BMI 32.80 kg/m   Wt Readings from Last 3 Encounters:  06/30/23 228 lb 9.6 oz (103.7 kg)  03/01/23 227 lb (103 kg)  10/29/22 220 lb 9.6 oz (100.1 kg)    Physical Exam   Recent Results (from the past 2160 hours)  Testosterone, Free, Total, SHBG     Status: Abnormal   Collection Time: 06/24/23  9:10 AM  Result Value Ref Range   Testosterone 226 (L) 264 - 916 ng/dL    Comment: Adult male reference interval is based on a population of healthy nonobese males (BMI <30) between 47 and 2 years old. Travison, et.al. JCEM [phone]. PMID: [id number].    Testosterone, Free 4.9 (L) 6.8 - 21.5 pg/mL   Sex Hormone Binding 19.6 16.5 - 55.9 nmol/L  PSA     Status: None   Collection Time: 06/24/23  9:10 AM  Result Value Ref Range   Prostate Specific Ag, Serum 0.5 0.0 - 4.0 ng/mL    Comment: Roche ECLIA methodology. According to the American Urological Association, Serum PSA should decrease and remain at  undetectable levels after radical prostatectomy. The AUA defines biochemical recurrence as an initial PSA value 0.2 ng/mL or greater followed by a subsequent confirmatory PSA value 0.2 ng/mL or greater. Values obtained with different assay methods or kits cannot be used interchangeably. Results cannot be interpreted as absolute evidence of the presence or absence of malignant disease.   CBC with Differential/Platelet     Status: None   Collection Time:  06/24/23  9:10 AM  Result Value Ref Range   WBC 4.9 3.4 - 10.8 x10E3/uL   RBC 5.12 4.14 - 5.80 x10E6/uL   Hemoglobin 15.1 13.0 - 17.7 g/dL   Hematocrit 16.1 09.6 - 51.0 %   MCV 89 79 - 97 fL   MCH 29.5 26.6 - 33.0 pg   MCHC 33.1 31.5 - 35.7 g/dL   RDW 04.5 40.9 - 81.1 %   Platelets 181 150 - 450 x10E3/uL   Neutrophils 58 Not Estab. %   Lymphs 28 Not Estab. %   Monocytes 7 Not Estab. %   Eos 6 Not Estab. %   Basos 1 Not Estab. %   Neutrophils Absolute 2.8 1.4 - 7.0 x10E3/uL   Lymphocytes Absolute 1.4 0.7 - 3.1 x10E3/uL   Monocytes Absolute 0.3 0.1 - 0.9 x10E3/uL   EOS (ABSOLUTE) 0.3 0.0 - 0.4 x10E3/uL   Basophils Absolute 0.1 0.0 - 0.2 x10E3/uL   Immature Granulocytes 0 Not Estab. 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   He wishes to be continued on testosterone replacement therapy.  His previsit labs show total testosterone of 226 improving from 1179, normal PSA.   No adverse effects of testosterone so far.  He is approached for lower dose until next measurement and he agrees. - He is advised to continue his testosterone cypionate at 150 mg (0.75 mL) IM every 14 days , to give him a total of 300 mg monthly with plan to repeat labs in 4 months. -His CMP, CBC, PSA are favorable so far.  He will have CBC, PSA along with his next testosterone measurements before his next  visit informed    His MRI is negative for sella/pituitary mass lesions. His PRL is normal at 13.3.  2. Prediabetes: -He reports that at a recent visit to his other providers A1c was higher, previously documented to have prediabetes.  He is following weight watchers program at this point with his family.    - he acknowledges that there is a room for improvement in his food and drink choices. - Suggestion is made for him to avoid simple carbohydrates  from his diet including Cakes, Sweet Desserts, Ice Cream, Soda (diet and regular), Sweet Tea, Candies, Chips, Cookies, Store Bought Juices, Alcohol in Excess of  1-2 drinks a day, Artificial Sweeteners,  Coffee Creamer, and "Sugar-free" Products, Lemonade. This will help patient to have more stable blood glucose profile and potentially avoid unintended weight gain.  3. Hypertension:  -His blood pressure is controlled to target.  He has no blood pressure medication at this time.  This is mainly due to recent significant weight loss.    - I advised patient to maintain close follow up with his PCP for primary care needs.   I spent  25 minutes in the care of the patient today including review of labs from Thyroid Function, CMP, and other relevant labs ; imaging/biopsy records (current and previous including abstractions from other facilities); face-to-face time discussing  his lab results and symptoms, medications doses, his options of short and long term treatment based on the latest standards of care / guidelines;   and documenting the encounter.  Gara Kroner  participated in the discussions, expressed understanding, and voiced agreement with the above plans.  All questions were answered to his satisfaction. he is encouraged to  contact clinic should he have any questions or concerns prior to his return visit.   Follow up plan: Return in about 4 months (around 10/28/2023) for Fasting Labs  in AM B4 8.  Marquis Lunch, MD Phone: [phone]  Fax:  [phone]   -  This note was partially dictated with voice recognition software. Similar sounding words can be transcribed inadequately or may not  be corrected upon review.  06/30/2023, 5:02 PM

## 2023-07-01 ENCOUNTER — Ambulatory Visit: Payer: BC Managed Care – PPO | Admitting: "Endocrinology

## 2023-09-15 ENCOUNTER — Other Ambulatory Visit (HOSPITAL_COMMUNITY): Payer: Self-pay

## 2023-09-15 ENCOUNTER — Telehealth: Payer: Self-pay

## 2023-09-15 NOTE — Telephone Encounter (Signed)
 Pharmacy Patient Advocate Encounter   Received notification from CoverMyMeds that prior authorization for Testosterone inj is required/requested.   Insurance verification completed.   The patient is insured through [employer] .   Per test claim: PA required and submitted KEY/EOC/Request #: [id number] CANCELLED due to The member recently filled this medication and will be able to return for their next refill according to their plan limits.

## 2023-10-23 LAB — PSA: Prostate Specific Ag, Serum: 0.5 ng/mL (ref 0.0–4.0)

## 2023-10-23 LAB — TESTOSTERONE, FREE, TOTAL, SHBG
Sex Hormone Binding: 18.7 nmol/L (ref 16.5–55.9)
Testosterone, Free: 19.6 pg/mL (ref 6.8–21.5)
Testosterone: 650 ng/dL (ref 264–916)

## 2023-10-28 ENCOUNTER — Ambulatory Visit: Payer: BC Managed Care – PPO | Admitting: "Endocrinology

## 2023-10-28 ENCOUNTER — Encounter: Payer: Self-pay | Admitting: "Endocrinology

## 2023-10-28 VITALS — BP 116/78 | HR 88 | Ht 70.0 in | Wt 225.4 lb

## 2023-10-28 DIAGNOSIS — R7303 Prediabetes: Secondary | ICD-10-CM | POA: Diagnosis not present

## 2023-10-28 DIAGNOSIS — E291 Testicular hypofunction: Secondary | ICD-10-CM

## 2023-10-28 MED ORDER — TESTOSTERONE CYPIONATE 200 MG/ML IM SOLN
150.0000 mg | INTRAMUSCULAR | 0 refills | Status: DC
Start: 2023-10-28 — End: 2024-03-07

## 2023-10-28 NOTE — Progress Notes (Signed)
 10/28/2023   Endocrinology follow-up note   Subjective:    Patient ID: Ian Robinson, male    DOB: 1976-01-25, PCP Pcp, No   Past Medical History:  Diagnosis Date   Arthritis    Asthma    GERD (gastroesophageal reflux disease)    History of kidney stones    Kidney stone    Scoliosis    Past Surgical History:  Procedure Laterality Date   APPENDECTOMY     arm fracture surgery     CARPAL TUNNEL RELEASE     left hand   COLONOSCOPY  08/11/2022   CYST REMOVAL HAND  12/2021   ESOPHAGOGASTRODUODENOSCOPY N/A 09/08/2018   Procedure: ESOPHAGOGASTRODUODENOSCOPY (EGD);  Surgeon: Celedonio Coil, MD;  Location: [facility];  Service: Endoscopy;  Laterality: N/A;   FOREIGN BODY REMOVAL N/A 09/08/2018   Procedure: FOREIGN BODY REMOVAL;  Surgeon: Celedonio Coil, MD;  Location: [facility];  Service: Endoscopy;  Laterality: N/A;   FRACTURE SURGERY     left wrist   KNEE ARTHROSCOPY     right knee   TOTAL KNEE ARTHROPLASTY Left 03/08/2018   Procedure: LEFT TOTAL KNEE ARTHROPLASTY;  Surgeon: Hazle Lites, MD;  Location: [facility];  Service: Orthopedics;  Laterality: Left;    Social History   Socioeconomic History   Marital status: Married    Spouse name: Not on file   Number of children: Not on file   Years of education: Not on file   Highest education level: Not on file  Occupational History   Not on file  Tobacco Use   Smoking status: Former    Current packs/day: 0.00    Types: Cigarettes    Quit date: 06/08/2007    Years since quitting: 16.4   Smokeless tobacco: Never  Vaping Use   Vaping status: Never Used  Substance and Sexual Activity   Alcohol use: Yes    Alcohol/week: 6.0 standard drinks of alcohol    Types: 6 Cans of beer per week    Comment: occ 2 to 3 times a week   Drug use: No   Sexual activity: Not on file  Other Topics Concern   Not on file  Social History Narrative   Not on file   Social Drivers of Health   Financial Resource Strain: Not on  file  Food Insecurity: Not on file  Transportation Needs: Not on file  Physical Activity: Not on file  Stress: Not on file  Social Connections: Not on file   Outpatient Encounter Medications as of 10/28/2023  Medication Sig   aspirin -acetaminophen -caffeine (EXCEDRIN MIGRAINE) 250-250-65 MG tablet Take 2 tablets by mouth every 6 (six) hours as needed for headache.   B-D 3CC LUER-LOK SYR 23GX1" 23G X 1" 3 ML MISC USE TO INJECT TESTOSTERONE    busPIRone (BUSPAR) 7.5 MG tablet Take 7.5 mg by mouth 3 (three) times daily as needed.   cetirizine (ZYRTEC) 10 MG tablet Take 10 mg by mouth daily.   fluticasone (FLONASE) 50 MCG/ACT nasal spray Place 1 spray into both nostrils daily as needed for allergies or rhinitis.   montelukast  (SINGULAIR ) 10 MG tablet Take 10 mg by mouth daily.    PROAIR  HFA 108 (90 Base) MCG/ACT inhaler Inhale 2 Inhalers into the lungs every 6 (six) hours as needed for wheezing or shortness of breath.   Syringe/Needle, Disp, (SYRINGE 3CC/20GX1") 20G X 1" 3 ML MISC Use to inject Testosterone  2 times a month   testosterone  cypionate (DEPOTESTOSTERONE CYPIONATE) 200 MG/ML injection  Inject 0.75 mLs (150 mg total) into the muscle every 14 (fourteen) days.   [DISCONTINUED] testosterone  cypionate (DEPOTESTOSTERONE CYPIONATE) 200 MG/ML injection Inject 0.75 mLs (150 mg total) into the muscle every 14 (fourteen) days.   No facility-administered encounter medications on file as of 10/28/2023.   ALLERGIES: Allergies  Allergen Reactions   Diclofenac     Upset stomach   Nsaids     Gastric ulcer    Other     Hay fever  Pistachios and cashews-itchy tongue     Percocet [Oxycodone -Acetaminophen ]     migraines   VACCINATION STATUS:  There is no immunization history on file for this patient.  HPI  Ian Robinson is a 48 year old male patient with medical history as above.    -He is being seen in follow-up for hypogonadism with repeat labs.     He is currently on testosterone  150 mg IM  every 14 days.   His previsit labs show total testosterone  of 650 ng per DL.  He feels better, reports compliance.  He has no new complaints today.      -He did not father any children, saying that his wife has fertility problems. He was subjected to sperm analysis in the past when he was found to have "low sperm count and motility". He denies testicular injury, chemotherapy, radiation, nor STDs. He reiterated that they are not looking for fertility. His MRI is negative for pituitary/sella mass lesion nor adenoma. he has remote ( at age 7) past face injury with no associated intracranial injury. His prolactin level was also normal, see below.  He has never taken androgens, steroids, nor opiates. he has new c/o frequent sinus infections.   Review of Systems  Limited as above.  Objective:    BP 116/78   Pulse 88   Ht 5\' 10"  (1.778 m)   Wt 225 lb 6.4 oz (102.2 kg)   BMI 32.34 kg/m   Wt Readings from Last 3 Encounters:  10/28/23 225 lb 6.4 oz (102.2 kg)  06/30/23 228 lb 9.6 oz (103.7 kg)  03/01/23 227 lb (103 kg)    Physical Exam   Recent Results (from the past 2160 hours)  Testosterone , Free, Total, SHBG     Status: None   Collection Time: 10/21/23  8:13 AM  Result Value Ref Range   Testosterone  650 264 - 916 ng/dL    Comment: Adult male reference interval is based on a population of healthy nonobese males (BMI <30) between 47 and 56 years old. Travison, et.al. JCEM [phone]. PMID: 91478295.    Testosterone , Free 19.6 6.8 - 21.5 pg/mL   Sex Hormone Binding 18.7 16.5 - 55.9 nmol/L  PSA     Status: None   Collection Time: 10/21/23  8:13 AM  Result Value Ref Range   Prostate Specific Ag, Serum 0.5 0.0 - 4.0 ng/mL    Comment: Roche ECLIA methodology. According to the American Urological Association, Serum PSA should decrease and remain at undetectable levels after radical prostatectomy. The AUA defines biochemical recurrence as an initial PSA value 0.2 ng/mL or  greater followed by a subsequent confirmatory PSA value 0.2 ng/mL or greater. Values obtained with different assay methods or kits cannot be used interchangeably. Results cannot be interpreted as absolute evidence of the presence or absence of malignant disease.     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  His CMP, CBC, PSA are favorable.  His previsit  labs show total testosterone  of 650 ng per DL  No adverse effects of testosterone  so far.  He is advised to continue  testosterone  cypionate at 150 mg (0.75 mL) IM every 14 days , to give him a total of 300 mg monthly with plan to repeat labs in 4 months.   His MRI is negative for sella/pituitary mass lesions. His PRL is normal at 13.3.  2. Prediabetes: -He reports that at a recent visit to his other providers A1c was higher, previously documented to have prediabetes.  He is no longer following weight watchers program at this point with his family.    - he acknowledges that there is a room for improvement in his food and drink choices. - Suggestion is made for him to avoid simple carbohydrates  from his diet including Cakes, Sweet Desserts, Ice Cream, Soda (diet and regular), Sweet Tea, Candies, Chips, Cookies, Store Bought Juices, Alcohol in Excess of  1-2 drinks a day, Artificial Sweeteners,  Coffee Creamer, and "Sugar-free" Products, Lemonade. This will help patient to have more stable blood glucose profile and potentially avoid unintended weight gain.  He will have point-of-care A1c during his next visit.  3. Hypertension:  -His blood pressure is controlled to target.  He has no blood pressure medication at this time.  This is mainly due to recent significant weight loss.    - I advised patient to maintain close follow up with his PCP for primary care needs.    I spent  21  minutes in the care of the patient today  including review of labs from Thyroid Function, CMP, and other relevant labs ; imaging/biopsy records (current and previous including abstractions from other facilities); face-to-face time discussing  his lab results and symptoms, medications doses, his options of short and long term treatment based on the latest standards of care / guidelines;   and documenting the encounter.  Alban Hudson  participated in the discussions, expressed understanding, and voiced agreement with the above plans.  All questions were answered to his satisfaction. he is encouraged to contact clinic should he have any questions or concerns prior to his return visit.    Follow up plan: Return in about 4 months (around 02/28/2024) for Fasting Labs  in AM B4 8, A1c -NV.  Kalvin Orf, MD Phone: [phone]  Fax: [phone]   -  This note was partially dictated with voice recognition software. Similar sounding words can be transcribed inadequately or may not  be corrected upon review.  10/28/2023, 8:28 AM

## 2024-01-19 ENCOUNTER — Telehealth: Payer: Self-pay

## 2024-01-19 ENCOUNTER — Other Ambulatory Visit (HOSPITAL_COMMUNITY): Payer: Self-pay

## 2024-01-19 NOTE — Telephone Encounter (Signed)
 Pharmacy Patient Advocate Encounter   Received notification from CoverMyMeds that prior authorization for Testosterone  inj 200mg  is required/requested.   Insurance verification completed.   The patient is insured through [employer] .   Per test claim: PA required; PA started via CoverMyMeds. KEY [id number] . Waiting for clinical questions to populate.

## 2024-01-27 ENCOUNTER — Other Ambulatory Visit (HOSPITAL_COMMUNITY): Payer: Self-pay

## 2024-01-27 NOTE — Telephone Encounter (Signed)
 New PA started through Latent. Key: [id number] Status is pending

## 2024-01-27 NOTE — Telephone Encounter (Signed)
 Pt has new insurance card, have put that in started 01/06/24.  Pt stated he really need RX by next week

## 2024-01-30 NOTE — Telephone Encounter (Signed)
 Pharmacy Patient Advocate Encounter  Received notification from [facility] that Prior Authorization for Testosterone  inj 200mg   has been DENIED.  Full denial letter will be uploaded to the media tab. See denial reason below.    PA #/Case ID/Reference #: [id number]

## 2024-01-30 NOTE — Telephone Encounter (Signed)
 ERROR

## 2024-02-16 NOTE — Telephone Encounter (Signed)
 Resubmitted PA with additional info. Key: [id number]  PA has been APPROVED.  PA Case: [id number]  02/16/2024 - 02/15/2025

## 2024-02-16 NOTE — Telephone Encounter (Signed)
 Left a message requesting pt return call to the office.

## 2024-02-17 NOTE — Telephone Encounter (Signed)
 Pt made aware.

## 2024-03-05 LAB — CBC WITH DIFFERENTIAL/PLATELET
Basophils Absolute: 0 x10E3/uL (ref 0.0–0.2)
Basos: 1 %
EOS (ABSOLUTE): 0.3 x10E3/uL (ref 0.0–0.4)
Eos: 7 %
Hematocrit: 46.4 % (ref 37.5–51.0)
Hemoglobin: 15.5 g/dL (ref 13.0–17.7)
Immature Grans (Abs): 0 x10E3/uL (ref 0.0–0.1)
Immature Granulocytes: 0 %
Lymphocytes Absolute: 1.1 x10E3/uL (ref 0.7–3.1)
Lymphs: 24 %
MCH: 30.3 pg (ref 26.6–33.0)
MCHC: 33.4 g/dL (ref 31.5–35.7)
MCV: 91 fL (ref 79–97)
Monocytes Absolute: 0.3 x10E3/uL (ref 0.1–0.9)
Monocytes: 7 %
Neutrophils Absolute: 2.8 x10E3/uL (ref 1.4–7.0)
Neutrophils: 61 %
Platelets: 178 x10E3/uL (ref 150–450)
RBC: 5.11 x10E6/uL (ref 4.14–5.80)
RDW: 14.2 % (ref 11.6–15.4)
WBC: 4.6 x10E3/uL (ref 3.4–10.8)

## 2024-03-05 LAB — TESTOSTERONE, FREE, TOTAL, SHBG
Sex Hormone Binding: 25.2 nmol/L (ref 16.5–55.9)
Testosterone, Free: 3.3 pg/mL — ABNORMAL LOW (ref 6.8–21.5)
Testosterone: 166 ng/dL — ABNORMAL LOW (ref 264–916)

## 2024-03-05 LAB — LIPID PANEL
Chol/HDL Ratio: 5.2 ratio — ABNORMAL HIGH (ref 0.0–5.0)
Cholesterol, Total: 173 mg/dL (ref 100–199)
HDL: 33 mg/dL — ABNORMAL LOW (ref >39)
LDL Chol Calc (NIH): 114 mg/dL — ABNORMAL HIGH (ref 0–99)
Triglycerides: 142 mg/dL (ref 0–149)
VLDL Cholesterol Cal: 26 mg/dL (ref 5–40)

## 2024-03-05 LAB — PSA: Prostate Specific Ag, Serum: 0.6 ng/mL (ref 0.0–4.0)

## 2024-03-07 ENCOUNTER — Other Ambulatory Visit: Payer: Self-pay | Admitting: "Endocrinology

## 2024-03-07 ENCOUNTER — Ambulatory Visit: Admitting: "Endocrinology

## 2024-03-07 ENCOUNTER — Encounter: Payer: Self-pay | Admitting: "Endocrinology

## 2024-03-07 VITALS — BP 120/82 | HR 68 | Ht 70.0 in | Wt 232.2 lb

## 2024-03-07 DIAGNOSIS — R7303 Prediabetes: Secondary | ICD-10-CM

## 2024-03-07 DIAGNOSIS — E291 Testicular hypofunction: Secondary | ICD-10-CM

## 2024-03-07 DIAGNOSIS — I10 Essential (primary) hypertension: Secondary | ICD-10-CM | POA: Diagnosis not present

## 2024-03-07 LAB — POCT GLYCOSYLATED HEMOGLOBIN (HGB A1C): HbA1c, POC (controlled diabetic range): 5.7 % (ref 0.0–7.0)

## 2024-03-07 MED ORDER — TESTOSTERONE CYPIONATE 200 MG/ML IM SOLN: 150.0000 mg | INTRAMUSCULAR | 0 refills | Status: AC

## 2024-03-07 MED ORDER — SYRINGE 20G X 1" 3 ML MISC: 1 refills | Status: AC

## 2024-03-07 NOTE — Progress Notes (Signed)
 03/07/2024   Endocrinology follow-up note   Subjective:    Patient ID: Ian Robinson, male    DOB: 12-03-1975, PCP Pcp, No   Past Medical History:  Diagnosis Date   Arthritis    Asthma    GERD (gastroesophageal reflux disease)    History of kidney stones    Kidney stone    Scoliosis    Past Surgical History:  Procedure Laterality Date   APPENDECTOMY     arm fracture surgery     CARPAL TUNNEL RELEASE     left hand   COLONOSCOPY  08/11/2022   CYST REMOVAL HAND  12/2021   ESOPHAGOGASTRODUODENOSCOPY N/A 09/08/2018   Procedure: ESOPHAGOGASTRODUODENOSCOPY (EGD);  Surgeon: Lennard Lesta FALCON, MD;  Location: [facility];  Service: Endoscopy;  Laterality: N/A;   FOREIGN BODY REMOVAL N/A 09/08/2018   Procedure: FOREIGN BODY REMOVAL;  Surgeon: Lennard Lesta FALCON, MD;  Location: [facility];  Service: Endoscopy;  Laterality: N/A;   FRACTURE SURGERY     left wrist   KNEE ARTHROSCOPY     right knee   TOTAL KNEE ARTHROPLASTY Left 03/08/2018   Procedure: LEFT TOTAL KNEE ARTHROPLASTY;  Surgeon: Heide Ingle, MD;  Location: [facility];  Service: Orthopedics;  Laterality: Left;    Social History   Socioeconomic History   Marital status: Married    Spouse name: Not on file   Number of children: Not on file   Years of education: Not on file   Highest education level: Not on file  Occupational History   Not on file  Tobacco Use   Smoking status: Former    Current packs/day: 0.00    Types: Cigarettes    Quit date: 06/08/2007    Years since quitting: 16.7   Smokeless tobacco: Never  Vaping Use   Vaping status: Never Used  Substance and Sexual Activity   Alcohol use: Yes    Alcohol/week: 6.0 standard drinks of alcohol    Types: 6 Cans of beer per week    Comment: occ 2 to 3 times a week   Drug use: No   Sexual activity: Not on file  Other Topics Concern   Not on file  Social History Narrative   Not on file   Social Drivers of Health   Financial Resource Strain: Not on  file  Food Insecurity: Not on file  Transportation Needs: Not on file  Physical Activity: Not on file  Stress: Not on file  Social Connections: Not on file   Outpatient Encounter Medications as of 03/07/2024  Medication Sig   aspirin -acetaminophen -caffeine (EXCEDRIN MIGRAINE) 250-250-65 MG tablet Take 2 tablets by mouth every 6 (six) hours as needed for headache.   B-D 3CC LUER-LOK SYR 23GX1 23G X 1 3 ML MISC USE TO INJECT TESTOSTERONE    busPIRone (BUSPAR) 7.5 MG tablet Take 7.5 mg by mouth 3 (three) times daily as needed.   cetirizine (ZYRTEC) 10 MG tablet Take 10 mg by mouth daily.   fluticasone (FLONASE) 50 MCG/ACT nasal spray Place 1 spray into both nostrils daily as needed for allergies or rhinitis.   montelukast  (SINGULAIR ) 10 MG tablet Take 10 mg by mouth daily.    PROAIR  HFA 108 (90 Base) MCG/ACT inhaler Inhale 2 Inhalers into the lungs every 6 (six) hours as needed for wheezing or shortness of breath.   Syringe/Needle, Disp, (SYRINGE 3CC/20GX1) 20G X 1 3 ML MISC Use to inject Testosterone  2 times a month   testosterone  cypionate (DEPOTESTOSTERONE CYPIONATE) 200 MG/ML injection  Inject 0.75 mLs (150 mg total) into the muscle every 14 (fourteen) days.   [DISCONTINUED] Syringe/Needle, Disp, (SYRINGE 3CC/20GX1) 20G X 1 3 ML MISC Use to inject Testosterone  2 times a month   [DISCONTINUED] testosterone  cypionate (DEPOTESTOSTERONE CYPIONATE) 200 MG/ML injection Inject 0.75 mLs (150 mg total) into the muscle every 14 (fourteen) days.   No facility-administered encounter medications on file as of 03/07/2024.   ALLERGIES: Allergies  Allergen Reactions   Diclofenac     Upset stomach   Nsaids     Gastric ulcer    Other     Hay fever  Pistachios and cashews-itchy tongue     Percocet [Oxycodone -Acetaminophen ]     migraines   VACCINATION STATUS:  There is no immunization history on file for this patient.  HPI  Ian Robinson is a 48 year old male patient with medical history as  above.    -He is being seen in follow-up for hypogonadism with repeat labs.     He is currently on testosterone  150 mg IM every 14 days.   His previsit labs show total testosterone  of 166 ng per DL on the last day of his injection cycle.   He feels better, reports compliance.  He has no new complaints today.      -He did not father any children, saying that his wife has fertility problems. He was subjected to sperm analysis in the past when he was found to have low sperm count and motility. He denies testicular injury, chemotherapy, radiation, nor STDs. He reiterated that they are not looking for fertility. His MRI is negative for pituitary/sella mass lesion nor adenoma. he has remote (at age 62) past face injury with no associated intracranial injury. His prolactin level was also normal, see below.  He has never taken androgens, steroids, nor opiates. he has new c/o frequent sinus infections.   Review of Systems  Limited as above.  Objective:    BP 120/82   Pulse 68   Ht 5' 10 (1.778 m)   Wt 232 lb 3.2 oz (105.3 kg)   BMI 33.32 kg/m   Wt Readings from Last 3 Encounters:  03/07/24 232 lb 3.2 oz (105.3 kg)  10/28/23 225 lb 6.4 oz (102.2 kg)  06/30/23 228 lb 9.6 oz (103.7 kg)    Physical Exam   Recent Results (from the past 2160 hours)  Testosterone , Free, Total, SHBG     Status: Abnormal   Collection Time: 03/01/24  9:31 AM  Result Value Ref Range   Testosterone  166 (L) 264 - 916 ng/dL    Comment: Adult male reference interval is based on a population of healthy nonobese males (BMI <30) between 58 and 6 years old. Travison, et.al. JCEM [phone]. PMID: 71675896.    Testosterone , Free 3.3 (L) 6.8 - 21.5 pg/mL   Sex Hormone Binding 25.2 16.5 - 55.9 nmol/L  CBC with Differential/Platelet     Status: None   Collection Time: 03/01/24  9:31 AM  Result Value Ref Range   WBC 4.6 3.4 - 10.8 x10E3/uL   RBC 5.11 4.14 - 5.80 x10E6/uL   Hemoglobin 15.5 13.0 - 17.7 g/dL    Hematocrit 53.5 62.4 - 51.0 %   MCV 91 79 - 97 fL   MCH 30.3 26.6 - 33.0 pg   MCHC 33.4 31.5 - 35.7 g/dL   RDW 85.7 88.3 - 84.5 %   Platelets 178 150 - 450 x10E3/uL   Neutrophils 61 Not Estab. %   Lymphs 24 Not Estab. %  Monocytes 7 Not Estab. %   Eos 7 Not Estab. %   Basos 1 Not Estab. %   Neutrophils Absolute 2.8 1.4 - 7.0 x10E3/uL   Lymphocytes Absolute 1.1 0.7 - 3.1 x10E3/uL   Monocytes Absolute 0.3 0.1 - 0.9 x10E3/uL   EOS (ABSOLUTE) 0.3 0.0 - 0.4 x10E3/uL   Basophils Absolute 0.0 0.0 - 0.2 x10E3/uL   Immature Granulocytes 0 Not Estab. %   Immature Grans (Abs) 0.0 0.0 - 0.1 x10E3/uL  Lipid panel     Status: Abnormal   Collection Time: 03/01/24  9:31 AM  Result Value Ref Range   Cholesterol, Total 173 100 - 199 mg/dL   Triglycerides 857 0 - 149 mg/dL   HDL 33 (L) >60 mg/dL   VLDL Cholesterol Cal 26 5 - 40 mg/dL   LDL Chol Calc (NIH) 885 (H) 0 - 99 mg/dL   Chol/HDL Ratio 5.2 (H) 0.0 - 5.0 ratio    Comment:                                   T. Chol/HDL Ratio                                             Men  Women                               1/2 Avg.Risk  3.4    3.3                                   Avg.Risk  5.0    4.4                                2X Avg.Risk  9.6    7.1                                3X Avg.Risk 23.4   11.0   PSA     Status: None   Collection Time: 03/01/24  9:31 AM  Result Value Ref Range   Prostate Specific Ag, Serum 0.6 0.0 - 4.0 ng/mL    Comment: Roche ECLIA methodology. According to the American Urological Association, Serum PSA should decrease and remain at undetectable levels after radical prostatectomy. The AUA defines biochemical recurrence as an initial PSA value 0.2 ng/mL or greater followed by a subsequent confirmatory PSA value 0.2 ng/mL or greater. Values obtained with different assay methods or kits cannot be used interchangeably. Results cannot be interpreted as absolute evidence of the presence or absence of malignant  disease.   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  His CMP, CBC,  PSA are favorable.  His previsit labs show total testosterone  of 166 on the last day of his injection cycle.    No adverse effects of testosterone  so far.  For better consistency in his serum total testosterone , he was approached for shorter frequency injections, however he opted to keep it every 14 days.  He is advised to continue  testosterone  cypionate at 150 mg (0.75 mL) IM every 14 days , to give him a total of 300 mg monthly with plan to repeat labs in 4 months.   His MRI is negative for sella/pituitary mass lesions. His PRL is normal at 13.3.  2. Prediabetes: -His point-of-care A1c is 5.7%, consistent with prediabetes as documented during his previous visit as well.    He is no longer following weight watchers program at this point with his family.    - he acknowledges that there is a room for improvement in his food and drink choices. - Suggestion is made for him to avoid simple carbohydrates  from his diet including Cakes, Sweet Desserts, Ice Cream, Soda (diet and regular), Sweet Tea, Candies, Chips, Cookies, Store Bought Juices, Alcohol in Excess of  1-2 drinks a day, Artificial Sweeteners,  Coffee Creamer, and Sugar-free Products, Lemonade. This will help patient to have more stable blood glucose profile and potentially avoid unintended weight gain.   3. Hypertension:  - His blood pressure is controlled to target.  He has no blood pressure medication at this time.  This is mainly due to recent significant weight loss.    - I advised patient to maintain close  follow up with his PCP for primary care needs.   I spent  25  minutes in the care of the patient today including review of labs from Thyroid Function, CMP, and other relevant labs ; imaging/biopsy records (current and previous including abstractions from other facilities); face-to-face time discussing  his lab results and symptoms, medications doses, his options of short and long term treatment based on the latest standards of care / guidelines;   and documenting the encounter.  Deward JONELLE Hamilton  participated in the discussions, expressed understanding, and voiced agreement with the above plans.  All questions were answered to his satisfaction. he is encouraged to contact clinic should he have any questions or concerns prior to his return visit.    Follow up plan: Return in about 4 months (around 07/08/2024) for Fasting Labs  in AM B4 8.  Ranny Earl, MD Phone: [phone]  Fax: [phone]   -  This note was partially dictated with voice recognition software. Similar sounding words can be transcribed inadequately or may not  be corrected upon review.  03/07/2024, 8:53 AM

## 2024-07-06 LAB — CBC WITH DIFFERENTIAL/PLATELET
Basophils Absolute: 0.1 10*3/uL (ref 0.0–0.2)
Basos: 1 %
EOS (ABSOLUTE): 0.4 10*3/uL (ref 0.0–0.4)
Eos: 7 %
Hematocrit: 45.3 % (ref 37.5–51.0)
Hemoglobin: 15.4 g/dL (ref 13.0–17.7)
Immature Grans (Abs): 0 10*3/uL (ref 0.0–0.1)
Immature Granulocytes: 0 %
Lymphocytes Absolute: 1.3 10*3/uL (ref 0.7–3.1)
Lymphs: 23 %
MCH: 30.9 pg (ref 26.6–33.0)
MCHC: 34 g/dL (ref 31.5–35.7)
MCV: 91 fL (ref 79–97)
Monocytes Absolute: 0.3 10*3/uL (ref 0.1–0.9)
Monocytes: 6 %
Neutrophils Absolute: 3.5 10*3/uL (ref 1.4–7.0)
Neutrophils: 62 %
Platelets: 178 10*3/uL (ref 150–450)
RBC: 4.99 x10E6/uL (ref 4.14–5.80)
RDW: 14 % (ref 11.6–15.4)
WBC: 5.6 10*3/uL (ref 3.4–10.8)

## 2024-07-06 LAB — TESTOSTERONE, FREE, TOTAL, SHBG
Sex Hormone Binding: 21.2 nmol/L (ref 16.5–55.9)
Testosterone, Free: 4.5 pg/mL — ABNORMAL LOW (ref 6.8–21.5)
Testosterone: 270 ng/dL (ref 264–916)

## 2024-07-10 ENCOUNTER — Ambulatory Visit: Admitting: "Endocrinology

## 2024-07-17 ENCOUNTER — Ambulatory Visit: Admitting: "Endocrinology
